# Patient Record
Sex: Male | Born: 1960 | Race: Black or African American | Hispanic: No | Marital: Married | Smoking: Current every day smoker
Health system: Southern US, Community
[De-identification: ages and names within clinical notes are randomized; demographics above are authoritative.]

## PROBLEM LIST (undated history)

## (undated) DIAGNOSIS — I10 Essential (primary) hypertension: Secondary | ICD-10-CM

## (undated) HISTORY — DX: Essential (primary) hypertension: I10

## (undated) HISTORY — PX: BACK SURGERY: SHX140

## (undated) HISTORY — PX: SPINE SURGERY: SHX786

---

## 2001-04-09 ENCOUNTER — Emergency Department (HOSPITAL_COMMUNITY): Admission: EM | Admit: 2001-04-09 | Discharge: 2001-04-09 | Payer: Self-pay | Admitting: Emergency Medicine

## 2004-10-05 ENCOUNTER — Emergency Department: Payer: Self-pay | Admitting: Emergency Medicine

## 2011-09-03 ENCOUNTER — Emergency Department: Payer: Self-pay | Admitting: Emergency Medicine

## 2011-09-07 ENCOUNTER — Ambulatory Visit: Payer: Self-pay | Admitting: Family Medicine

## 2011-09-07 ENCOUNTER — Emergency Department: Payer: Self-pay | Admitting: *Deleted

## 2012-03-01 ENCOUNTER — Emergency Department: Payer: Self-pay | Admitting: Emergency Medicine

## 2012-12-12 ENCOUNTER — Emergency Department: Payer: Self-pay | Admitting: Emergency Medicine

## 2013-07-07 ENCOUNTER — Ambulatory Visit: Payer: Self-pay | Admitting: Family Medicine

## 2013-08-16 ENCOUNTER — Emergency Department: Payer: Self-pay | Admitting: Emergency Medicine

## 2013-08-25 ENCOUNTER — Ambulatory Visit: Payer: Self-pay | Admitting: Anesthesiology

## 2013-08-27 ENCOUNTER — Ambulatory Visit: Payer: Self-pay | Admitting: Family Medicine

## 2013-09-08 ENCOUNTER — Ambulatory Visit: Payer: Self-pay | Admitting: Anesthesiology

## 2013-10-07 ENCOUNTER — Ambulatory Visit: Payer: Self-pay | Admitting: Anesthesiology

## 2013-11-12 ENCOUNTER — Ambulatory Visit: Payer: Self-pay | Admitting: Anesthesiology

## 2013-12-16 ENCOUNTER — Ambulatory Visit: Payer: Self-pay | Admitting: Anesthesiology

## 2015-02-11 NOTE — H&P (Signed)
PATIENT NAME:  Jeffrey Figueroa, Jeffrey Figueroa MR#:  [id number] DATE OF BIRTH:  1961-09-08  DATE OF ADMISSION:  08/25/2013  CHIEF COMPLAINT: Persistent low back pain.   PROCEDURE: None.   HISTORY OF PRESENT ILLNESS: Mr. Jeffrey Figueroa presents for new patient evaluation. He has a complicated history with a previous anterior cervical disk procedure performed at [facility] approximately 1 year ago. He was having substantial left neck and left arm pain at that time from his report and he states that he continues to have this. He is also complaining of some left lower extremity give-way weakness that is happening on a frequent basis, approximately once or twice a day. This is mainly pain related. He also has some pain radiating into the right anterior thigh with a stinging type of pain. He is generally a difficult historian. However, he states that the weakness primarily affects the left lower extremity with pain on movement. No problems with bowel or bladder control are noted at this time. The pain is described as aching, annoying, agonizing, and getting longer and worse. It starts in the right lower back with a pin and needle sensation, radiating into the right anterior thigh, and he has chronic pain in the left knee and foot. He also has chronic left-side shoulder and hand pain with associated numbness and tingling he reports. He has also been followed by Dr. Thana Figueroa for this condition. He is due for an MRI on Thursday.   PAST MEDICAL HISTORY: Significant for high blood pressure. Positive for smoking, depression.   PAST SURGICAL HISTORY: Include appendectomy, neck surgery.   SOCIAL HISTORY: He works at [employer], has 2 children, and started smoking when he was [id number]. He works full-time as a Production designer, theatre/television/filmmanager at [employer].   Recent interventions include gabapentin, ibuprofen, meloxicam.  CURRENT MEDICATIONS: Include gabapentin, ibuprofen, meloxicam.  FAMILY HISTORY: Significant for diabetes, eye problems, high blood pressure.   PHYSICAL  EXAMINATION:  GENERAL: Reveals a pleasant black male, 54 years of age, with a VAS of 7/10.  HEENT: Pupils are equally round and reactive to light. Extraocular muscles intact.  HEART: Regular rate and rhythm without murmur.  LUNGS: Clear to auscultation bilaterally.  VITAL SIGNS: Blood pressure 128/73, pulse 72 and regular, respirations 18, O2 sat 97% with a VAS of 7/10.  EXTREMITIES: With the patient in the supine position, he has a positive straight leg raise at approximately 45 degrees on the left, negative on the right. He has strength at 4/5 with flexion at the left knee and extension at the left foot. Otherwise, his strength appears to be well preserved. With the patient in the standing position, he has pain on extension and right lateral rotation worse than left lateral rotation at the facet joints in the low back. He also has significant paraspinous  muscle tenderness bilaterally.   ASSESSMENT: 1.  Low back syndrome with degenerative disk disease and left-side radicular symptoms, L5 distribution.  2.  Lumbar facet arthropathy, right greater than left.  3.  Myofascial low back pain, severe.  4.  History of previous cervical diskectomy with persistent left-side neuralgic pain affecting the left upper extremity. 5. Continued smoking history with history of hypertension.   PLAN: 1.  We are going to await the lumbar MRI study this Thursday. Based on that, we may consider a lumbar epidural steroid injection for his chronic low back pain.  2.  He also may be a candidate for facet injection for diagnostic purpose.  3.  Continue followup with Dr. Thana Figueroa and continue followup with  his [facility] neurosurgeon for persistent neck pain as indicated.    ____________________________ Jeffrey Figueroa. Jeffrey Dupre, Figueroa jga:jm D: 08/25/2013 16:06:52 ET T: 08/25/2013 17:07:38 ET JOB#: [id number]  cc: Jeffrey Figueroa. Jeffrey Dupre, Figueroa, <Dictator> Jeffrey Mascot, Figueroa Jeffrey Figueroa Jeffrey Figueroa ELECTRONICALLY SIGNED 08/27/2013 16:55

## 2019-03-13 ENCOUNTER — Emergency Department
Admission: EM | Admit: 2019-03-13 | Discharge: 2019-03-13 | Disposition: A | Discharge status: Home / Self Care | Payer: PRIVATE HEALTH INSURANCE | Attending: Emergency Medicine | Admitting: Emergency Medicine

## 2019-03-13 ENCOUNTER — Other Ambulatory Visit: Payer: Self-pay

## 2019-03-13 DIAGNOSIS — F17210 Nicotine dependence, cigarettes, uncomplicated: Secondary | ICD-10-CM | POA: Diagnosis not present

## 2019-03-13 DIAGNOSIS — M79644 Pain in right finger(s): Secondary | ICD-10-CM | POA: Diagnosis present

## 2019-03-13 DIAGNOSIS — L03011 Cellulitis of right finger: Secondary | ICD-10-CM | POA: Diagnosis not present

## 2019-03-13 MED ORDER — CEPHALEXIN 500 MG PO CAPS
500.0000 mg | ORAL_CAPSULE | Freq: Three times a day (TID) | ORAL | 0 refills | Status: DC
Start: 2019-03-13 — End: 2023-08-26

## 2019-03-13 MED ORDER — LIDOCAINE HCL (PF) 1 % IJ SOLN
5.0000 mL | Freq: Once | INTRAMUSCULAR | Status: AC
  Administered 2019-03-13: 5 mL via INTRADERMAL
  Filled 2019-03-13: qty 5

## 2019-03-13 MED ORDER — TRAMADOL HCL 50 MG PO TABS: 50.0000 mg | ORAL_TABLET | Freq: Four times a day (QID) | ORAL | 0 refills | Status: DC | PRN

## 2019-03-13 NOTE — ED Triage Notes (Signed)
Pt c/o pain and swelling at the cuticle of the right thumb for the past 2 days, denies injury

## 2019-03-13 NOTE — ED Provider Notes (Signed)
[facility] Emergency Department Provider Note  ____________________________________________   First MD Initiated Contact with Patient 03/13/19 1032     (approximate)  I have reviewed the triage vital signs and the nursing notes.   HISTORY  Chief Complaint Hand Pain (finger)    HPI Jeffrey Figueroa is a 58 y.o. male presents emergency department with complaints of right thumb pain.  He states that the area has been swollen below the nail.  States he cut his finger nails too short.  Denies any fever or chills.  He has been using warm water to soak the finger without any relief.    History reviewed. No pertinent past medical history.  There are no active problems to display for this patient.   Past Surgical History:  Procedure Laterality Date  . BACK SURGERY      Prior to Admission medications   Medication Sig Start Date End Date Taking? Authorizing Provider  cephALEXin (KEFLEX) 500 MG capsule Take 1 capsule (500 mg total) by mouth 3 (three) times daily. 03/13/19   , Roselyn Bering, PA-C  traMADol (ULTRAM) 50 MG tablet Take 1 tablet (50 mg total) by mouth every 6 (six) hours as needed. 03/13/19   Faythe Ghee, PA-C    Allergies Patient has no known allergies.  No family history on file.  Social History Social History   Tobacco Use  . Smoking status: Current Every Day Smoker    Types: Cigarettes  . Smokeless tobacco: Never Used  Substance Use Topics  . Alcohol use: Yes  . Drug use: Not Currently    Review of Systems  Constitutional: No fever/chills Eyes: No visual changes. ENT: No sore throat. Respiratory: Denies cough Genitourinary: Negative for dysuria. Musculoskeletal: Negative for back pain.  Positive for right thumb pain Skin: Negative for rash.    ____________________________________________   PHYSICAL EXAM:  VITAL SIGNS: ED Triage Vitals  Enc Vitals Group     BP 03/13/19 1034 (!) 146/83     Pulse Rate 03/13/19 1034 77      Resp 03/13/19 1034 18     Temp 03/13/19 1034 98.2 F (36.8 C)     Temp Source 03/13/19 1034 Oral     SpO2 03/13/19 1034 96 %     Weight 03/13/19 1026 190 lb (86.2 kg)     Height 03/13/19 1026 5\' 7"  (1.702 m)     Head Circumference --      Peak Flow --      Pain Score 03/13/19 1026 5     Pain Loc --      Pain Edu? --      Excl. in GC? --     Constitutional: Alert and oriented. Well appearing and in no acute distress. Eyes: Conjunctivae are normal.  Head: Atraumatic. Nose: No congestion/rhinnorhea. Mouth/Throat: Mucous membranes are moist.   Neck:  supple no lymphadenopathy noted Cardiovascular: Normal rate, regular rhythm.  Respiratory: Normal respiratory effort.  No retractions, GU: deferred Musculoskeletal: FROM all extremities, warm and well perfused, right thumb is red swollen and tender below the nailbed typical of a paronychia, neurovascular is intact, redness is not circumferential Neurologic:  Normal speech and language.  Skin:  Skin is warm, dry and intact. No rash noted. Psychiatric: Mood and affect are normal. Speech and behavior are normal.  ____________________________________________   LABS (all labs ordered are listed, but only abnormal results are displayed)  Labs Reviewed - No data to display ____________________________________________   ____________________________________________  RADIOLOGY  ____________________________________________   PROCEDURES  Procedure(s) performed:   Marland Kitchen.Marland Kitchen.Incision and Drainage Date/Time: 03/13/2019 12:02 PM Performed by: Faythe GheeFisher,  W, PA-C Authorized by: Faythe GheeFisher,  W, PA-C   Consent:    Consent obtained:  Verbal   Consent given by:  Patient   Risks discussed:  Bleeding, incomplete drainage, pain and infection   Alternatives discussed:  No treatment Location:    Type:  Abscess   Location:  Upper extremity   Upper extremity location:  Finger   Finger location:  R thumb Pre-procedure details:    Skin  preparation:  Betadine Anesthesia (see MAR for exact dosages):    Anesthesia method:  Nerve block   Block anesthetic:  Lidocaine 1% w/o epi   Block injection procedure:  Anatomic landmarks identified, introduced needle, incremental injection, anatomic landmarks palpated and negative aspiration for blood   Block outcome:  Incomplete block Procedure type:    Complexity:  Simple Procedure details:    Incision types:  Stab incision   Incision depth:  Subcutaneous   Scalpel blade:  11   Drainage:  Purulent   Drainage amount:  Scant   Wound treatment:  Wound left open   Packing materials:  None Post-procedure details:    Patient tolerance of procedure:  Tolerated well, no immediate complications      ____________________________________________   INITIAL IMPRESSION / ASSESSMENT AND PLAN / ED COURSE  Pertinent labs & imaging results that were available during my care of the patient were reviewed by me and considered in my medical decision making (see chart for details).   Patient is 58 year old male presents emergency department with redness and swelling below the nailbed typical of paronychia.  Physical exam shows the area to be red tender and swollen.  Digital block was performed.  Patient states he can still feel the area.  Small incision was made and he tolerated incision well.  Small amount of pus was drained.  Dressing was applied by nursing staff.  Patient was given a prescription for Keflex and tramadol.  To return if worsening.  He is to continue the warm water soaks.  He states he understands will comply.  Is discharged stable condition     As part of my medical decision making, I reviewed the following data within the electronic MEDICAL RECORD NUMBER Nursing notes reviewed and incorporated, Old chart reviewed, Notes from prior ED visits and [place] Controlled Substance Database  ____________________________________________   FINAL CLINICAL IMPRESSION(S) / ED DIAGNOSES  Final  diagnoses:  Paronychia of finger of right hand      NEW MEDICATIONS STARTED DURING THIS VISIT:  Discharge Medication List as of 03/13/2019 11:37 AM    START taking these medications   Details  cephALEXin (KEFLEX) 500 MG capsule Take 1 capsule (500 mg total) by mouth 3 (three) times daily., Starting Fri 03/13/2019, Normal    traMADol (ULTRAM) 50 MG tablet Take 1 tablet (50 mg total) by mouth every 6 (six) hours as needed., Starting Fri 03/13/2019, Normal         Note:  This document was prepared using Dragon voice recognition software and may include unintentional dictation errors.    Faythe GheeFisher,  W, PA-C 03/13/19 1205    Jene EveryKinner, Robert, MD 03/13/19 1259

## 2019-03-13 NOTE — ED Notes (Signed)
See triage note  Presents with pain and swelling to right thumb  Denies any injury but developed sxs' 2 days ago

## 2019-03-13 NOTE — Discharge Instructions (Signed)
Soak the thumb in warm water with Epson salts 3 times daily.  Take the antibiotic as prescribed.  Return to the emergency department if worsening.

## 2021-09-06 ENCOUNTER — Emergency Department
Admission: EM | Admit: 2021-09-06 | Discharge: 2021-09-06 | Disposition: A | Discharge status: Home / Self Care | Payer: No Typology Code available for payment source | Attending: Emergency Medicine | Admitting: Emergency Medicine

## 2021-09-06 ENCOUNTER — Encounter: Payer: Self-pay | Admitting: Emergency Medicine

## 2021-09-06 ENCOUNTER — Emergency Department: Payer: No Typology Code available for payment source

## 2021-09-06 ENCOUNTER — Other Ambulatory Visit: Payer: Self-pay

## 2021-09-06 DIAGNOSIS — M542 Cervicalgia: Secondary | ICD-10-CM | POA: Insufficient documentation

## 2021-09-06 DIAGNOSIS — R531 Weakness: Secondary | ICD-10-CM | POA: Diagnosis not present

## 2021-09-06 DIAGNOSIS — F17210 Nicotine dependence, cigarettes, uncomplicated: Secondary | ICD-10-CM | POA: Diagnosis not present

## 2021-09-06 DIAGNOSIS — R200 Anesthesia of skin: Secondary | ICD-10-CM | POA: Insufficient documentation

## 2021-09-06 DIAGNOSIS — M546 Pain in thoracic spine: Secondary | ICD-10-CM | POA: Insufficient documentation

## 2021-09-06 DIAGNOSIS — Z23 Encounter for immunization: Secondary | ICD-10-CM | POA: Diagnosis not present

## 2021-09-06 MED ORDER — IBUPROFEN 400 MG PO TABS
400.0000 mg | ORAL_TABLET | Freq: Once | ORAL | Status: AC
  Administered 2021-09-06: 400 mg via ORAL
  Filled 2021-09-06: qty 1

## 2021-09-06 MED ORDER — TETANUS-DIPHTH-ACELL PERTUSSIS 5-2.5-18.5 LF-MCG/0.5 IM SUSY
0.5000 mL | PREFILLED_SYRINGE | Freq: Once | INTRAMUSCULAR | Status: AC
  Administered 2021-09-06: 0.5 mL via INTRAMUSCULAR
  Filled 2021-09-06: qty 0.5

## 2021-09-06 NOTE — Discharge Instructions (Signed)
Your CAT scans of your head and neck did not show any injury.  You can take Tylenol and Motrin for your pain.
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
## 2021-12-07 ENCOUNTER — Other Ambulatory Visit: Payer: Self-pay

## 2021-12-07 ENCOUNTER — Emergency Department: Payer: Managed Care, Other (non HMO)

## 2021-12-07 ENCOUNTER — Emergency Department
Admission: EM | Admit: 2021-12-07 | Discharge: 2021-12-07 | Disposition: A | Discharge status: Home / Self Care | Payer: Managed Care, Other (non HMO) | Attending: Emergency Medicine | Admitting: Emergency Medicine

## 2021-12-07 DIAGNOSIS — R059 Cough, unspecified: Secondary | ICD-10-CM | POA: Diagnosis present

## 2021-12-07 DIAGNOSIS — U071 COVID-19: Secondary | ICD-10-CM | POA: Diagnosis not present

## 2021-12-07 LAB — RESP PANEL BY RT-PCR (FLU A&B, COVID) ARPGX2
Influenza A by PCR: NEGATIVE
Influenza B by PCR: NEGATIVE
SARS Coronavirus 2 by RT PCR: POSITIVE — AB

## 2021-12-07 MED ORDER — ONDANSETRON 4 MG PO TBDP
4.0000 mg | ORAL_TABLET | Freq: Once | ORAL | Status: AC
  Administered 2021-12-07: 4 mg via ORAL
  Filled 2021-12-07: qty 1

## 2021-12-07 MED ORDER — HYDROCOD POLI-CHLORPHE POLI ER 10-8 MG/5ML PO SUER: 5.0000 mL | Freq: Every evening | ORAL | 0 refills | Status: DC | PRN

## 2021-12-07 MED ORDER — ONDANSETRON 4 MG PO TBDP: 4.0000 mg | ORAL_TABLET | Freq: Three times a day (TID) | ORAL | 0 refills | Status: DC | PRN

## 2021-12-07 NOTE — ED Notes (Signed)
Pt states that he has congestion, headache, upset stomach, onset Tuesday. Pt reports he has not eaten anything since Tuesday. Denies vomiting/diarrhea. Pt took home COVID test & it was positive.

## 2021-12-07 NOTE — ED Triage Notes (Signed)
Pt presents to ER c/o fatigue, body aches, loss of taste, productive cough and congestion that started on Tuesday.  Pt states he took home covid test and was positive.  Pt denies sob at this time.  Pt A&O x4 otherwise.

## 2021-12-07 NOTE — Discharge Instructions (Signed)
Take nausea medication as needed.  Use nighttime cough medication as needed.  If any worsening cough, shortness of breath return to the ER.

## 2021-12-07 NOTE — ED Provider Notes (Signed)
[facility] EMERGENCY DEPARTMENT Provider Note   CSN: [id number] Arrival date & time: 12/07/21  2043     History  Chief Complaint  Patient presents with   COVID Symptoms     Jeffrey Figueroa is a 61 y.o. male with no known past medical history presents to the emergency department for evaluation of body aches, chills, nausea, cough, congestion, diarrhea.  Symptoms have been present for 2 days.  He is tolerating p.o. well, mostly taking in some fluids.  Denies any abdominal pain, high fevers, chest pain, shortness of breath.  No rashes.  He has been taking some over-the-counter medications with mild relief.  Denies any wheezing  HPI     Home Medications Prior to Admission medications   Medication Sig Start Date End Date Taking? Authorizing Provider  chlorpheniramine-HYDROcodone (TUSSIONEX PENNKINETIC ER) 10-8 MG/5ML Take 5 mLs by mouth at bedtime as needed for cough. 12/07/21  Yes Evon Slack, PA-C  ondansetron (ZOFRAN-ODT) 4 MG disintegrating tablet Take 1 tablet (4 mg total) by mouth every 8 (eight) hours as needed for nausea or vomiting. 12/07/21  Yes Evon Slack, PA-C  cephALEXin (KEFLEX) 500 MG capsule Take 1 capsule (500 mg total) by mouth 3 (three) times daily. 03/13/19   Fisher, Roselyn Bering, PA-C  traMADol (ULTRAM) 50 MG tablet Take 1 tablet (50 mg total) by mouth every 6 (six) hours as needed. 03/13/19   Faythe Ghee, PA-C      Allergies    Patient has no known allergies.    Review of Systems   Review of Systems  Constitutional:  Positive for chills. Negative for fever.  HENT:  Positive for congestion, rhinorrhea and sore throat. Negative for sinus pressure and sinus pain.   Respiratory:  Positive for cough. Negative for shortness of breath, wheezing and stridor.   Cardiovascular:  Negative for chest pain.  Gastrointestinal:  Negative for abdominal pain, diarrhea, nausea and vomiting.  Genitourinary:  Negative for difficulty urinating, dysuria and  urgency.  Musculoskeletal:  Negative for arthralgias, back pain, myalgias and neck stiffness.  Skin:  Negative for rash.  Neurological:  Negative for dizziness and headaches.   Physical Exam Updated Vital Signs BP 127/86 (BP Location: Left Arm)    Pulse (!) 105    Temp 98.6 F (37 C) (Oral)    Resp 20    Ht 5\' 7"  (1.702 m)    Wt 89.8 kg    SpO2 93%    BMI 31.01 kg/m  Physical Exam Constitutional:      General: He is not in acute distress.    Appearance: Normal appearance. He is well-developed. He is not ill-appearing.  HENT:     Head: Normocephalic and atraumatic.     Nose: Rhinorrhea present.     Mouth/Throat:     Pharynx: No oropharyngeal exudate or posterior oropharyngeal erythema.  Eyes:     Conjunctiva/sclera: Conjunctivae normal.  Cardiovascular:     Rate and Rhythm: Normal rate.     Pulses: Normal pulses.     Heart sounds: Normal heart sounds.  Pulmonary:     Effort: Pulmonary effort is normal. No respiratory distress.     Breath sounds: Normal breath sounds. No stridor. No wheezing, rhonchi or rales.  Abdominal:     General: Abdomen is flat. There is no distension.     Tenderness: There is no abdominal tenderness. There is no guarding.  Musculoskeletal:        General: Normal range of motion.  Cervical back: Normal range of motion.  Skin:    General: Skin is warm.     Findings: No rash.  Neurological:     General: No focal deficit present.     Mental Status: He is alert and oriented to person, place, and time.  Psychiatric:        Behavior: Behavior normal.        Thought Content: Thought content normal.    ED Results / Procedures / Treatments   Labs (all labs ordered are listed, but only abnormal results are displayed) Labs Reviewed  RESP PANEL BY RT-PCR (FLU A&B, COVID) ARPGX2 - Abnormal; Notable for the following components:      Result Value   SARS Coronavirus 2 by RT PCR POSITIVE (*)    All other components within normal limits     EKG None  Radiology DG Chest 1 View  Result Date: 12/07/2021 CLINICAL DATA:  No history EXAM: CHEST  1 VIEW COMPARISON:  None. FINDINGS: The heart and mediastinal contours are unchanged. Aortic calcification. No focal consolidation. No pulmonary edema. No pleural effusion. No pneumothorax. No acute osseous abnormality. IMPRESSION: 1. No active disease. 2.  Aortic Atherosclerosis (ICD10-I70.0). Electronically Signed   By: Tish Frederickson M.D.   On: 12/07/2021 21:18    Procedures Procedures    Medications Ordered in ED Medications  ondansetron (ZOFRAN-ODT) disintegrating tablet 4 mg (has no administration in time range)    ED Course/ Medical Decision Making/ A&P                           Medical Decision Making Amount and/or Complexity of Data Reviewed Radiology: ordered.  Risk Prescription drug management.   61 year old male COVID-positive.  Has had symptoms for 2 days.  His vital signs appear stable.  He is not complaining of any chest pain or shortness of breath.  He is having some cough and nausea.  Patient a candidate for Paxlovid pending blood work to check kidney function but unfortunately patient with no recent blood work and unwilling to stay for blood work to ensure kidney function is within normal limits to prescribe Paxlovid.  Patient requested prescription for cough medication and nausea medication, states he is ready to be discharged.  Patient understands signs and symptoms return to the ER for such as any shortness of breath, fevers above 101 that or not going down with Tylenol, inability to keep food or fluids down or any urgent changes in his health Final Clinical Impression(s) / ED Diagnoses Final diagnoses:  Cough  COVID-19    Rx / DC Orders ED Discharge Orders          Ordered    chlorpheniramine-HYDROcodone (TUSSIONEX PENNKINETIC ER) 10-8 MG/5ML  At bedtime PRN        12/07/21 2243    ondansetron (ZOFRAN-ODT) 4 MG disintegrating tablet  Every 8 hours  PRN        12/07/21 2243              Evon Slack, PA-C 12/07/21 2249    Merwyn Katos, MD 12/08/21 1640

## 2022-03-01 IMAGING — CR DG CHEST 1V
1 series · 1 of 1 positions shown · non-contrast
Comparison: None.

CLINICAL DATA: No history

EXAM:
CHEST  1 VIEW

[chest pa]
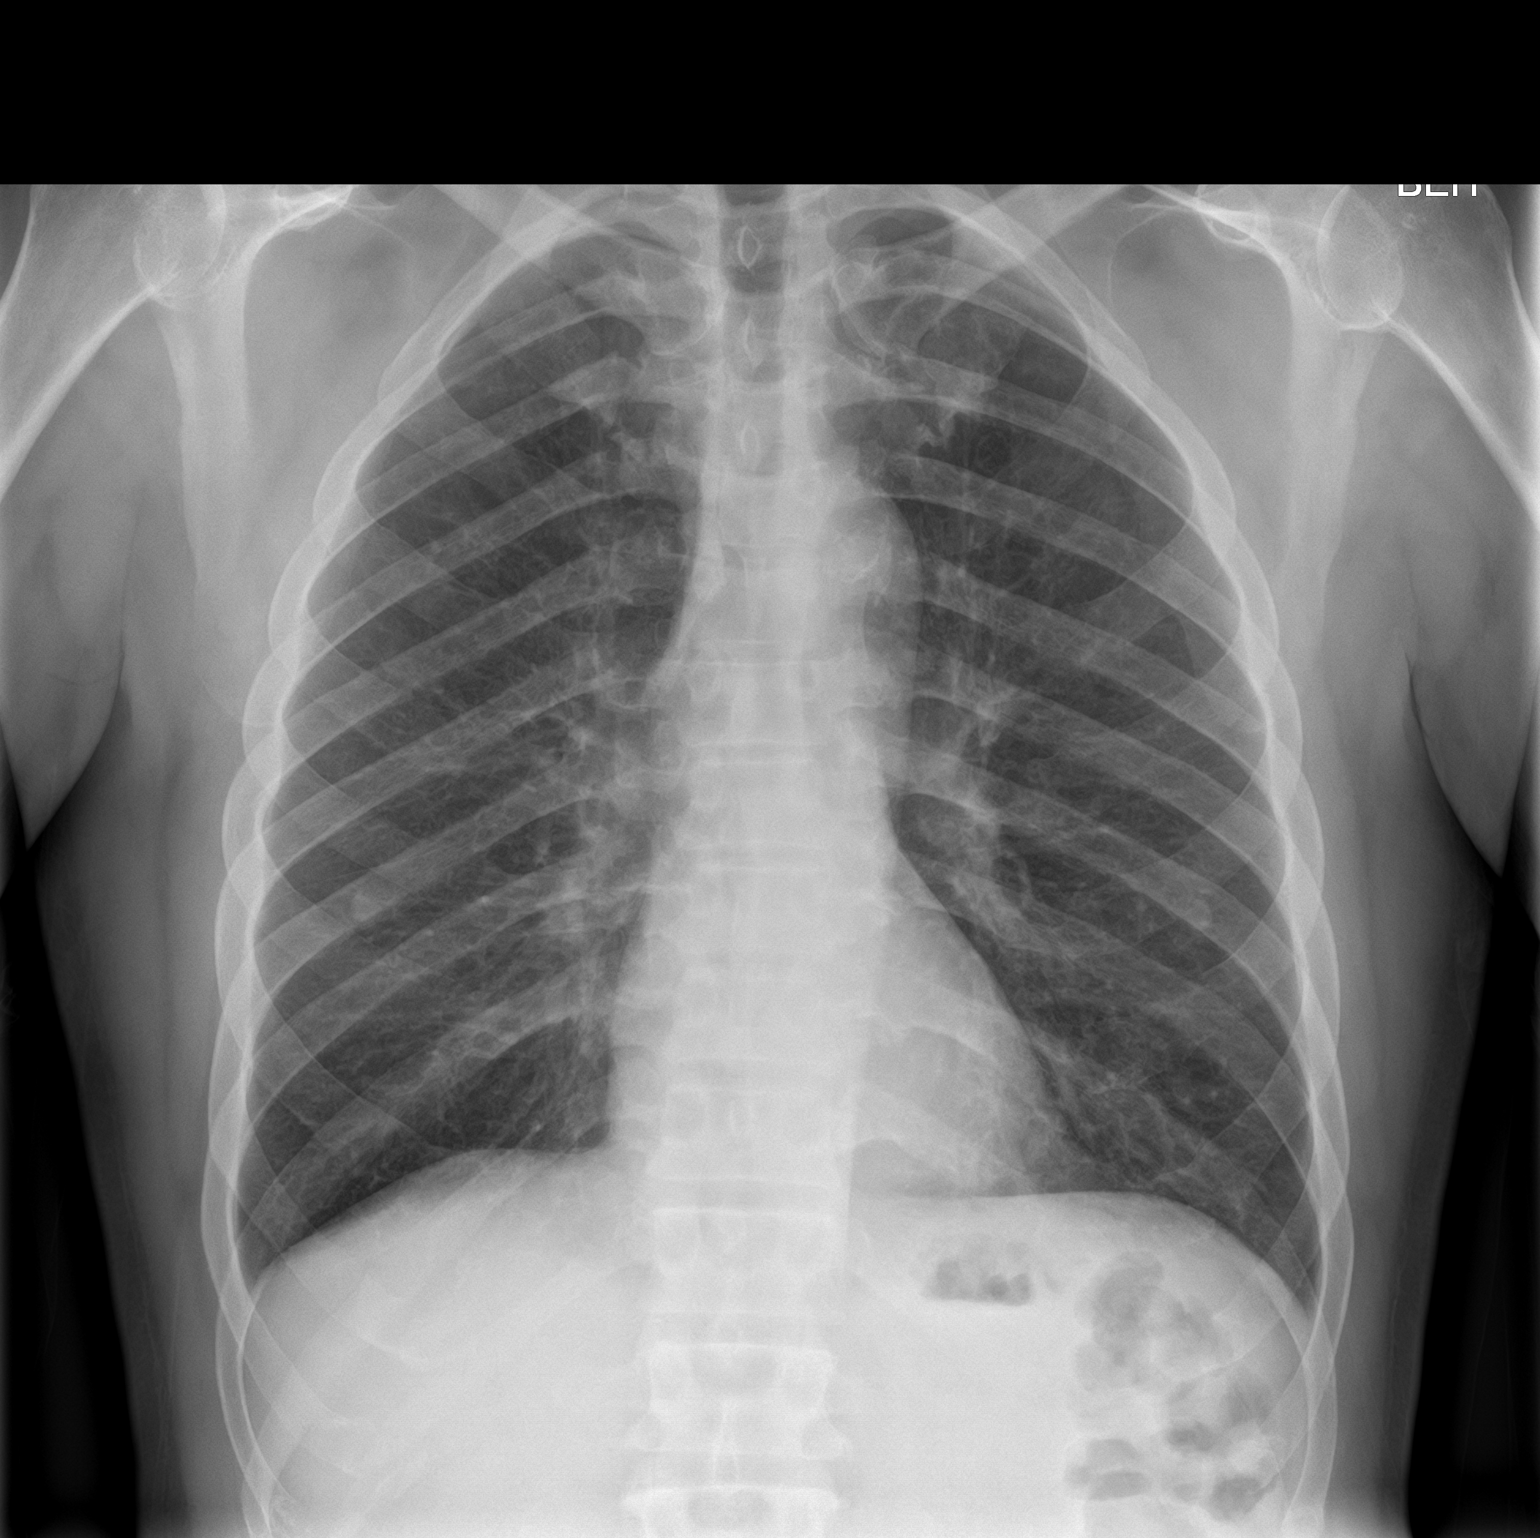

[1 of 1 positions shown; findings below may reference images not displayed]

FINDINGS: The heart and mediastinal contours are unchanged. Aortic
calcification.

No focal consolidation. No pulmonary edema. No pleural effusion. No
pneumothorax.

No acute osseous abnormality.
IMPRESSION: 1. No active disease.
2.  Aortic Atherosclerosis ([id number]).

## 2023-08-16 ENCOUNTER — Ambulatory Visit: Payer: No Typology Code available for payment source

## 2023-08-16 ENCOUNTER — Encounter: Payer: Self-pay | Admitting: Emergency Medicine

## 2023-08-16 ENCOUNTER — Ambulatory Visit
Admission: EM | Admit: 2023-08-16 | Discharge: 2023-08-16 | Disposition: A | Discharge status: Home / Self Care | Payer: No Typology Code available for payment source | Attending: Physician Assistant | Admitting: Physician Assistant

## 2023-08-16 DIAGNOSIS — R0781 Pleurodynia: Secondary | ICD-10-CM

## 2023-08-16 DIAGNOSIS — W19XXXA Unspecified fall, initial encounter: Secondary | ICD-10-CM

## 2023-08-16 NOTE — ED Triage Notes (Signed)
Patient states that he fell couple days ago.  Patient reports ongoing left mid back pain.  Patient also reports pain in his ribs when he coughs or takes a deep breath.

## 2023-08-16 NOTE — ED Provider Notes (Signed)
[facility]    CSN: [id number] Arrival date & time: 08/16/23  1014      History   Chief Complaint Chief Complaint  Patient presents with   Fall   Rib Pain    HPI Jeffrey Figueroa is a 62 y.o. male.   Patient is here for left-sided rib pain back pain.  Patient states that he fell a couple days ago falling onto his left side states that he is concerned he has a rib fracture it is a hard time to take a deep breath or when he coughs.  Patient did not lose consciousness at the time alert and oriented.  Patient has been taking ibuprofen with minimal relief.  Denies any chest pain or bloody emesis.    History reviewed. No pertinent past medical history.  There are no problems to display for this patient.   Past Surgical History:  Procedure Laterality Date   BACK SURGERY         Home Medications    Prior to Admission medications   Medication Sig Start Date End Date Taking? Authorizing Provider  cephALEXin (KEFLEX) 500 MG capsule Take 1 capsule (500 mg total) by mouth 3 (three) times daily. 03/13/19   Sherrie Mustache Roselyn Bering, PA-C  chlorpheniramine-HYDROcodone (TUSSIONEX PENNKINETIC ER) 10-8 MG/5ML Take 5 mLs by mouth at bedtime as needed for cough. 12/07/21   Evon Slack, PA-C  ondansetron (ZOFRAN-ODT) 4 MG disintegrating tablet Take 1 tablet (4 mg total) by mouth every 8 (eight) hours as needed for nausea or vomiting. 12/07/21   Evon Slack, PA-C  traMADol (ULTRAM) 50 MG tablet Take 1 tablet (50 mg total) by mouth every 6 (six) hours as needed. 03/13/19   Sherrie Mustache Roselyn Bering, PA-C    Family History History reviewed. No pertinent family history.  Social History Social History   Tobacco Use   Smoking status: Every Day    Types: Cigarettes   Smokeless tobacco: Never  Vaping Use   Vaping status: Never Used  Substance Use Topics   Alcohol use: Yes   Drug use: Not Currently     Allergies   Patient has no known allergies.   Review of Systems Review of Systems   Constitutional: Negative.   Respiratory:  Positive for cough. Negative for shortness of breath, wheezing and stridor.        Left-sided rib pain that radiates slightly towards back  Cardiovascular: Negative.   Gastrointestinal: Negative.   Musculoskeletal:        Slight rib and back pain on left side  Skin: Negative.   Neurological: Negative.      Physical Exam Triage Vital Signs ED Triage Vitals  Encounter Vitals Group     BP 08/16/23 1059 (!) 167/93     Systolic BP Percentile --      Diastolic BP Percentile --      Pulse Rate 08/16/23 1059 73     Resp 08/16/23 1059 15     Temp 08/16/23 1059 98.2 F (36.8 C)     Temp Source 08/16/23 1059 Oral     SpO2 08/16/23 1059 96 %     Weight 08/16/23 1057 172 lb (78 kg)     Height 08/16/23 1057 5\' 7"  (1.702 m)     Head Circumference --      Peak Flow --      Pain Score 08/16/23 1057 8     Pain Loc --      Pain Education --  Exclude from Growth Chart --    No data found.  Updated Vital Signs BP (!) 167/93 (BP Location: Right Arm)   Pulse 73   Temp 98.2 F (36.8 C) (Oral)   Resp 15   Ht 5\' 7"  (1.702 m)   Wt 172 lb (78 kg)   SpO2 96%   BMI 26.94 kg/m   Visual Acuity Right Eye Distance:   Left Eye Distance:   Bilateral Distance:    Right Eye Near:   Left Eye Near:    Bilateral Near:     Physical Exam Constitutional:      Appearance: Normal appearance.  Cardiovascular:     Rate and Rhythm: Normal rate.  Pulmonary:     Effort: Pulmonary effort is normal.  Abdominal:     General: Abdomen is flat.  Musculoskeletal:        General: Normal range of motion.     Cervical back: Normal range of motion.     Comments: Tenderness to palpation to left rib area second and third rib.  No signs of bruising no noted edema.  Neurological:     General: No focal deficit present.     Mental Status: He is alert.      UC Treatments / Results  Labs (all labs ordered are listed, but only abnormal results are  displayed) Labs Reviewed - No data to display  EKG   Radiology DG Ribs Unilateral W/Chest Left  Result Date: 08/16/2023 CLINICAL DATA:  Fall, back and rib pain EXAM: LEFT RIBS AND CHEST-5 VIEW COMPARISON:  12/07/2021 FINDINGS: No displaced fracture or other bone lesions are seen involving the ribs. There is no evidence of pneumothorax or pleural effusion. Both lungs are clear. Cardiac and mediastinal contours are within normal limits. IMPRESSION: No displaced rib fracture.  No acute cardiopulmonary process. Electronically Signed   By: Wiliam Ke M.D.   On: 08/16/2023 11:35    Procedures Procedures (including critical care time)  Medications Ordered in UC Medications - No data to display  Initial Impression / Assessment and Plan / UC Course  I have reviewed the triage vital signs and the nursing notes.  Pertinent labs & imaging results that were available during my care of the patient were reviewed by me and considered in my medical decision making (see chart for details).     X-ray was negative for any rib fractures Discussed with patient signs of pneumonia to monitor for patient will need to continue to take deep breaths and apply warm compresses to rib area. Can either take ibuprofen as needed for pain If symptoms persist after 1 week he can follow-up for follow-up Final Clinical Impressions(s) / UC Diagnoses   Final diagnoses:  Fall, initial encounter  Rib pain on left side     Discharge Instructions      X-ray was negative for any rib fractures Discussed with patient signs of pneumonia to monitor for patient will need to continue to take deep breaths and apply warm compresses to rib area. Can either take ibuprofen as needed for pain If symptoms persist after 1 week he can follow-up for follow-up     ED Prescriptions   None    PDMP not reviewed this encounter.   Coralyn Mark, NP 08/16/23 1148

## 2023-08-16 NOTE — Discharge Instructions (Addendum)
X-ray was negative for any rib fractures Discussed with patient signs of pneumonia to monitor for patient will need to continue to take deep breaths and apply warm compresses to rib area. Can either take ibuprofen as needed for pain If symptoms persist after 1 week he can follow-up for follow-up

## 2023-08-19 ENCOUNTER — Other Ambulatory Visit: Payer: Self-pay

## 2023-08-19 ENCOUNTER — Emergency Department
Admission: EM | Admit: 2023-08-19 | Discharge: 2023-08-20 | Disposition: A | Discharge status: Home / Self Care | Payer: 59 | Attending: Emergency Medicine | Admitting: Emergency Medicine

## 2023-08-19 ENCOUNTER — Ambulatory Visit: Payer: Self-pay | Admitting: *Deleted

## 2023-08-19 DIAGNOSIS — R109 Unspecified abdominal pain: Secondary | ICD-10-CM | POA: Diagnosis present

## 2023-08-19 DIAGNOSIS — K5900 Constipation, unspecified: Secondary | ICD-10-CM | POA: Insufficient documentation

## 2023-08-19 LAB — COMPREHENSIVE METABOLIC PANEL
ALT: 17 U/L (ref 0–44)
AST: 20 U/L (ref 15–41)
Albumin: 4.4 g/dL (ref 3.5–5.0)
Alkaline Phosphatase: 67 U/L (ref 38–126)
Anion gap: 10 (ref 5–15)
BUN: 12 mg/dL (ref 8–23)
CO2: 22 mmol/L (ref 22–32)
Calcium: 8.8 mg/dL — ABNORMAL LOW (ref 8.9–10.3)
Chloride: 103 mmol/L (ref 98–111)
Creatinine, Ser: 0.81 mg/dL (ref 0.61–1.24)
GFR, Estimated: >60 mL/min (ref >60)
Glucose, Bld: 119 mg/dL — ABNORMAL HIGH (ref 70–99)
Potassium: 3.5 mmol/L (ref 3.5–5.1)
Sodium: 135 mmol/L (ref 135–145)
Total Bilirubin: 0.9 mg/dL (ref 0.3–1.2)
Total Protein: 7.8 g/dL (ref 6.5–8.1)

## 2023-08-19 LAB — CBC
HCT: 47.8 % (ref 39.0–52.0)
Hemoglobin: 15.8 g/dL (ref 13.0–17.0)
MCH: 33.4 pg (ref 26.0–34.0)
MCHC: 33.1 g/dL (ref 30.0–36.0)
MCV: 101.1 fL — ABNORMAL HIGH (ref 80.0–100.0)
Platelets: 180 10*3/uL (ref 150–400)
RBC: 4.73 MIL/uL (ref 4.22–5.81)
RDW: 11.9 % (ref 11.5–15.5)
WBC: 7 10*3/uL (ref 4.0–10.5)
nRBC: 0 % (ref 0.0–0.2)

## 2023-08-19 LAB — LIPASE, BLOOD: Lipase: 25 U/L (ref 11–51)

## 2023-08-19 NOTE — Telephone Encounter (Signed)
Attempted to call patient and wife at contact numbers provided. No answer on both numbers and call back message left. Since third attempt to reach patient- call will be closed.
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
## 2023-08-19 NOTE — ED Triage Notes (Addendum)
Pt to ED via POV c/o generalized abd pain and constipation x4days. Pt reports abd pain ever since he fell 4 days ago. Pt also having left mid back pain from fall. Pt ahs been using enema, laxatives with no relief. Denies any fevers, cp, sob, dizziness

## 2023-08-20 MED ORDER — LACTULOSE 10 GM/15ML PO SOLN
20.0000 g | Freq: Once | ORAL | Status: AC
  Administered 2023-08-20: 20 g via ORAL
  Filled 2023-08-20: qty 30

## 2023-08-20 NOTE — ED Provider Notes (Signed)
[facility] Provider Note    Event Date/Time   First MD Initiated Contact with Patient 08/19/23 2346     (approximate)   History   Abdominal Pain   HPI Jeffrey Figueroa is a 62 y.o. male who presents for evaluation of about 4 to 5 days of constipation and resultant abdominal pain.  He said he had a fall about 4 days ago and he was evaluated at urgent care and told he did not have any broken ribs.  He still has some pain associated with that in the left lower part of his back.  However he said that his main issue is that he has not been able to have a good bowel movement.  He has taken a laxative that he got from [facility] but it has not worked.  Normally he goes once a day but it has been a couple of days since he has passed much of anything.  He is passing gas.  No vomiting but he feels like if he eats anything it "sits on my stomach" for a long time.  No recent surgery or immobilization.  He has not been taking narcotics.     Physical Exam   Triage Vital Signs: ED Triage Vitals [08/19/23 2009]  Encounter Vitals Group     BP (!) 136/97     Systolic BP Percentile      Diastolic BP Percentile      Pulse Rate 78     Resp 20     Temp 98.2 F (36.8 C)     Temp Source Oral     SpO2 99 %     Weight      Height      Head Circumference      Peak Flow      Pain Score 9     Pain Loc      Pain Education      Exclude from Growth Chart     Most recent vital signs: Vitals:   08/20/23 0004 08/20/23 0019  BP: (!) 178/85 (!) 178/85  Pulse: 62 80  Resp:  17  Temp:  98.4 F (36.9 C)  SpO2: 100% 100%    General: Awake, no distress.  Generally well-appearing. CV:  Good peripheral perfusion.  Regular rate and rhythm. Resp:  Normal effort. Speaking easily and comfortably, no accessory muscle usage nor intercostal retractions.   Abd:  No distention.  No tenderness to palpation of the abdomen.  Patient continues to be able to talk and give me a history all  during deep palpation. Other:  Normal mood and affect.   ED Results / Procedures / Treatments   Labs (all labs ordered are listed, but only abnormal results are displayed) Labs Reviewed  COMPREHENSIVE METABOLIC PANEL - Abnormal; Notable for the following components:      Result Value   Glucose, Bld 119 (*)    Calcium 8.8 (*)    All other components within normal limits  CBC - Abnormal; Notable for the following components:   MCV 101.1 (*)    All other components within normal limits  LIPASE, BLOOD     PROCEDURES:  Critical Care performed: No  Procedures    IMPRESSION / MDM / ASSESSMENT AND PLAN / ED COURSE  I reviewed the triage vital signs and the nursing notes.  Differential diagnosis includes, but is not limited to, constipation, SBO/ileus, less likely intra-abdominal bleed.  Patient's presentation is most consistent with acute presentation with potential threat to life or bodily function.  Labs/studies ordered: CMP, lipase, CBC  Interventions/Medications given:  Medications  lactulose (CHRONULAC) 10 GM/15ML solution 20 g (has no administration in time range)    (Note:  hospital course my include additional interventions and/or labs/studies not listed above.)   Patient is hypertensive but otherwise normal vital signs.  Very benign abdominal exam.  He has no tenderness to palpation at all.  Some musculoskeletal pain from his fall 4 days ago, and I read the urgent care note and verified the report.  But I have no concern for acute intra-abdominal bleeding, perforation, nor infection based on his physical exam.  Similarly, his lab work is very reassuring with a normal CMP, lipase, and CBC.  I had my usual constipation discussion with the patient and he will get a dose of lactulose prior to discharge.  He will try an aggressive regimen at home of OTC constipation remedies which I provided in written form.  He and his wife agree with the  plan and I gave my usual return precautions.  No indication for imaging at this time.         FINAL CLINICAL IMPRESSION(S) / ED DIAGNOSES   Final diagnoses:  Constipation, unspecified constipation type     Rx / DC Orders   ED Discharge Orders     None        Note:  This document was prepared using Dragon voice recognition software and may include unintentional dictation errors.   Loleta Rose, MD 08/20/23 0100

## 2023-08-20 NOTE — Discharge Instructions (Addendum)
You were seen in the emergency department today for constipation.  We recommend that you use one or more of the following over-the-counter medications in the order described:   1)  Miralax (powder):  This medication works by drawing additional fluid into your intestines and helps to flush out your stool.  Mix the powder with water or juice according to label instructions.  Be sure to use the recommended amount of water or juice when you mix up the powder.  Plenty of fluids will help to prevent constipation.  You can take this twice a day; just be sure to drink plenty of fluids. 2)  Colace (or Dulcolax) 100 mg:  This is a stool softener, and you may take it once or twice a day as needed. 3)  Senna tablets:  This is a bowel stimulant that will help "push" out your stool. It is the next step to add after you have tried a stool softener. 4)  Magnesium citrate: This is typically sold in a clear glass bottle also purchased without the need for prescription.  You can drink the bottle and it typically stimulates your bowels within a short period of time.  You may also want to consider using glycerin suppositories, which you insert into your rectum.  You hold it in place and is dissolves and softens your stool and stimulates your bowels.  You could also consider using an enema, which is also available over the counter.  Remember that narcotic pain medications are constipating, so avoid them or minimize their use.  Drink plenty of fluids.  Please return to the Emergency Department immediately if you develop new or worsening symptoms that concern you, such as (but not limited to) fever > 101 degrees, severe abdominal pain, or persistent vomiting.

## 2023-08-26 ENCOUNTER — Encounter (HOSPITAL_BASED_OUTPATIENT_CLINIC_OR_DEPARTMENT_OTHER): Payer: Self-pay | Admitting: Family Medicine

## 2023-08-26 ENCOUNTER — Ambulatory Visit (INDEPENDENT_AMBULATORY_CARE_PROVIDER_SITE_OTHER): Payer: 59 | Admitting: Family Medicine

## 2023-08-26 VITALS — BP 175/86 | HR 58 | Ht 67.0 in | Wt 165.0 lb

## 2023-08-26 DIAGNOSIS — R030 Elevated blood-pressure reading, without diagnosis of hypertension: Secondary | ICD-10-CM | POA: Diagnosis not present

## 2023-08-26 DIAGNOSIS — R1084 Generalized abdominal pain: Secondary | ICD-10-CM | POA: Insufficient documentation

## 2023-08-26 DIAGNOSIS — R10811 Right upper quadrant abdominal tenderness: Secondary | ICD-10-CM | POA: Insufficient documentation

## 2023-08-26 DIAGNOSIS — I10 Essential (primary) hypertension: Secondary | ICD-10-CM | POA: Insufficient documentation

## 2023-08-26 LAB — HEMOGLOBIN A1C
Est. average glucose Bld gHb Est-mCnc: 111 mg/dL
Hgb A1c MFr Bld: 5.5 % (ref 4.8–5.6)

## 2023-08-26 NOTE — Progress Notes (Signed)
New Patient Office Visit  Subjective    Patient ID: Jeffrey Figueroa, male    DOB: 07-Apr-1961  Age: 62 y.o. MRN: [id number]  CC:  Chief Complaint  Patient presents with   Abdominal Pain    Left side, ongoing since recent fall on 10/25   Bloated    Feels like food just sits on his stomach, indigestion     HPI Jeffrey Figueroa is a 62 year old male who presents to establish with Primary Care & Sports Medicine at [facility]. He has concerns today about ongoing abdominal pain from 08/17/2023. He reports experiencing 2 days of abdominal pain before going to [employer]- seen on 10/28 with no abnormal findings & benign exam.  He reports this abdominal pain is constant- reports that his stomach is "always grumbling." He reports suffering from constipation- LBM yesterday.   He feels like foot just sits in his stomach and there are some days where he just cannot eat.  Reports one day where it was black. Denies bright red blood, N/V, change in appetite, weight loss, fever/chills, urinary symptoms.  His BM was soft, not hard to pass. Patient reports no relief with OTC enema or laxatives, even no relief with Lactulose prescribed.   He reports suffering from a fall on 08/16/2023 with L-sided rib pain.  Reports the pain feels like 5/10 and does not radiate.  Ibuprofen- does help the pain   Outpatient Encounter Medications as of 08/26/2023  Medication Sig   [DISCONTINUED] cephALEXin (KEFLEX) 500 MG capsule Take 1 capsule (500 mg total) by mouth 3 (three) times daily. (Patient not taking: Reported on 08/26/2023)   [DISCONTINUED] chlorpheniramine-HYDROcodone (TUSSIONEX PENNKINETIC ER) 10-8 MG/5ML Take 5 mLs by mouth at bedtime as needed for cough. (Patient not taking: Reported on 08/26/2023)   [DISCONTINUED] ondansetron (ZOFRAN-ODT) 4 MG disintegrating tablet Take 1 tablet (4 mg total) by mouth every 8 (eight) hours as needed for nausea or vomiting. (Patient not taking: Reported on  08/26/2023)   [DISCONTINUED] traMADol (ULTRAM) 50 MG tablet Take 1 tablet (50 mg total) by mouth every 6 (six) hours as needed. (Patient not taking: Reported on 08/26/2023)   No facility-administered encounter medications on file as of 08/26/2023.    History reviewed. No pertinent past medical history.  Past Surgical History:  Procedure Laterality Date   BACK SURGERY     SPINE SURGERY     Titanium Rod    No family history on file.  Review of Systems  Constitutional:  Negative for chills, fever, malaise/fatigue and weight loss.  Respiratory:  Negative for cough and shortness of breath.   Cardiovascular:  Negative for chest pain and palpitations.  Gastrointestinal:  Positive for abdominal pain. Negative for blood in stool, constipation, diarrhea, heartburn, melena, nausea and vomiting.  Genitourinary:  Negative for frequency and urgency.  Neurological:  Negative for dizziness, weakness and headaches.  Psychiatric/Behavioral:  Negative for depression and suicidal ideas. The patient is not nervous/anxious and does not have insomnia.      Objective    BP (!) 175/86 Comment: Repeat BP  Pulse (!) 58   Ht 5\' 7"  (1.702 m)   Wt 165 lb (74.8 kg)   SpO2 100%   BMI 25.84 kg/m   Physical Exam Constitutional:      Appearance: Normal appearance. He is well-developed.  Cardiovascular:     Rate and Rhythm: Normal rate and regular rhythm.     Heart sounds: Normal heart sounds.  Pulmonary:     Effort: Pulmonary effort  is normal.     Breath sounds: Normal breath sounds.  Abdominal:     General: Abdomen is flat. Bowel sounds are normal. There is no distension.     Palpations: Abdomen is soft. There is no hepatomegaly, splenomegaly or mass.     Tenderness: There is generalized abdominal tenderness and tenderness in the right upper quadrant. There is no right CVA tenderness, left CVA tenderness, guarding or rebound.     Hernia: No hernia is present.  Neurological:     Mental Status: He is  alert.  Psychiatric:        Mood and Affect: Mood normal.        Behavior: Behavior normal.      Assessment & Plan:   1. Generalized abdominal pain Patient is a 62 year old male who presents today for concerns regarding ongoing abdominal pain with reported constipation. He reports that his last bowel movement was yesterday but has not had a "good" bowel movement. Reports an increase in bowel sounds and activity. He feels that food "sits on my stomach" whenever he eats something. Denies N/V, diarrhea, blood in stool, melena, loss of appetite, change in bowel movements. Soft abdomen with noted generalized abdominal pain in all quadrants, more tenderness in the RUQ to palpation. No guarding or rebound tenderness present. No acute red flags present on exam- less likely to have acute infection, perforation, or abdominal bleeding. Discussed OTC medications to prevent constipation. Will obtain liver and pancreatic function today and update patient with labs. Will obtain hemoglobin A1c to determine if patient has diabetes, causing delayed gastric emptying. Will also order abdominal US to determine etiology of abdominal pain. If imaging is unremarkable, would be reasonable to refer to GI for further evaluation. Patient reports he has never had a colonoscopy. - US Abdomen Complete; Future - Hemoglobin A1c  2. Right upper quadrant abdominal tenderness without rebound tenderness Patient reports more tenderness in his RUQ. No rebound tenderness or guarding present. Will obtain BMP, amylase, and hemoglobin A1c today. Ordered complete abdominal US for generalized pain in all quadrants. No acute findings on physical exam.  - Basic Metabolic Panel (BMET) - Amylase  3. Elevated blood pressure reading in office without diagnosis of hypertension Patient presents today with slightly elevated blood pressure, repeat with elevated blood pressure. Patient in no acute distress and is well-appearing. Denies chest pain,  shortness of breath, lower extremity edema, vision changes, headaches. Cardiovascular exam with heart regular rate and rhythm. Normal heart sounds, no murmurs present. No lower extremity edema present. Lungs clear to auscultation bilaterally. Patient is not currently taking pharmacotherapy for hypertension. Advised patient to monitor blood pressure at home. Follow-up in 6-8 weeks for elevated blood pressure.      Return in 6 weeks (on 10/07/2023), or if symptoms worsen or fail to improve, for elevated BP.   Alyson Reedy, FNP

## 2023-08-26 NOTE — Patient Instructions (Signed)

## 2023-08-27 ENCOUNTER — Other Ambulatory Visit (HOSPITAL_BASED_OUTPATIENT_CLINIC_OR_DEPARTMENT_OTHER): Payer: Self-pay | Admitting: Family Medicine

## 2023-08-27 DIAGNOSIS — R1084 Generalized abdominal pain: Secondary | ICD-10-CM

## 2023-08-27 LAB — BASIC METABOLIC PANEL
BUN/Creatinine Ratio: 13 (ref 10–24)
BUN: 12 mg/dL (ref 8–27)
CO2: 20 mmol/L (ref 20–29)
Calcium: 9.4 mg/dL (ref 8.6–10.2)
Chloride: 104 mmol/L (ref 96–106)
Creatinine, Ser: 0.9 mg/dL (ref 0.76–1.27)
Glucose: 73 mg/dL (ref 70–99)
Potassium: 4.1 mmol/L (ref 3.5–5.2)
Sodium: 142 mmol/L (ref 134–144)
eGFR: 97 mL/min/{1.73_m2} (ref >59)

## 2023-08-27 LAB — AMYLASE: Amylase: 54 U/L (ref 31–110)

## 2023-08-28 ENCOUNTER — Ambulatory Visit
Admission: RE | Admit: 2023-08-28 | Discharge: 2023-08-28 | Disposition: A | Discharge status: Home / Self Care | Payer: 59 | Source: Ambulatory Visit | Attending: Family Medicine | Admitting: Family Medicine

## 2023-08-28 DIAGNOSIS — R1084 Generalized abdominal pain: Secondary | ICD-10-CM

## 2023-09-12 ENCOUNTER — Encounter (HOSPITAL_BASED_OUTPATIENT_CLINIC_OR_DEPARTMENT_OTHER): Payer: Self-pay | Admitting: Family Medicine

## 2023-10-08 ENCOUNTER — Encounter: Payer: Self-pay | Admitting: Physician Assistant

## 2023-10-08 ENCOUNTER — Ambulatory Visit (INDEPENDENT_AMBULATORY_CARE_PROVIDER_SITE_OTHER): Payer: 59 | Admitting: Physician Assistant

## 2023-10-08 VITALS — BP 174/88 | HR 80 | Temp 98.4°F | Ht 67.0 in | Wt 167.0 lb

## 2023-10-08 DIAGNOSIS — M79671 Pain in right foot: Secondary | ICD-10-CM | POA: Diagnosis not present

## 2023-10-08 DIAGNOSIS — I700 Atherosclerosis of aorta: Secondary | ICD-10-CM | POA: Insufficient documentation

## 2023-10-08 DIAGNOSIS — I10 Essential (primary) hypertension: Secondary | ICD-10-CM

## 2023-10-08 DIAGNOSIS — F17200 Nicotine dependence, unspecified, uncomplicated: Secondary | ICD-10-CM | POA: Insufficient documentation

## 2023-10-08 MED ORDER — AMLODIPINE BESYLATE 10 MG PO TABS: 10.0000 mg | ORAL_TABLET | Freq: Every day | ORAL | 1 refills | Status: DC

## 2023-10-08 NOTE — Patient Instructions (Signed)
-  It was a pleasure to see you today! Please review your visit summary for helpful information -I would encourage you to follow your care via MyChart where you can access lab results, notes, messages, and more -If you feel that we did a nice job today, please complete your after-visit survey and leave [place] a Google review! Your CMA today was Mariann Barter and your provider was Alvester Morin, PA-C, DMSc -Please return for follow-up in about 3 weeks for routine physical

## 2023-10-08 NOTE — Progress Notes (Signed)
Date:  10/08/2023   Name:  Javid Imperatore   DOB:  [birth year]   MRN:  [id number]   Chief Complaint: Establish Care and Foot Pain (X1-2 years,Right foot, getting worse, aching, 7 pain score, painful to touch, hurts when walking, has a bunion on the bottom of foot, pt tried to cut it at home, not getting bigger in size)  HPI Billyjo is a pleasant 62 year old male with a history of untreated HTN new to the practice today to establish care.  Historically he does not utilize primary care.  Recently established at our clinic at [facility] 08/26/2023 but would like to transfer care here since I also take care of his wife Misty Stanley.  Labs from that time all normal including BMP, A1c, CBC, lipase, amylase.   Reports that doctors always tell him he has high blood pressure, but he says this is due to whitecoat hypertension.  He feels confident that blood pressure drops when he leaves the office, but has never actually checked outside the doctor's office.  He does have a blood pressure cuff at home which his wife uses.  Complains of lesions on the right foot causing pain.  He has never seen podiatry.   Medication list has been reviewed and updated.  Current Meds  Medication Sig   amLODipine (NORVASC) 10 MG tablet Take 1 tablet (10 mg total) by mouth daily.     Review of Systems  Patient Active Problem List   Diagnosis Date Noted   Aortic atherosclerosis (HCC) 10/08/2023   Tobacco use disorder 10/08/2023   Generalized abdominal pain 08/26/2023   Primary hypertension 08/26/2023   Right upper quadrant abdominal tenderness without rebound tenderness 08/26/2023    No Known Allergies  Immunization History  Administered Date(s) Administered   Influenza-Unspecified 07/24/2023   PFIZER(Purple Top)SARS-COV-2 Vaccination 07/24/2023   Tdap 09/06/2021    Past Surgical History:  Procedure Laterality Date   BACK SURGERY     SPINE SURGERY     Titanium Rod    Social History   Tobacco Use    Smoking status: Every Day    Current packs/day: 0.75    Average packs/day: 0.7 packs/day for 43.0 years (32.2 ttl pk-yrs)    Types: Cigarettes    Start date: 1982   Smokeless tobacco: Never  Vaping Use   Vaping status: Never Used  Substance Use Topics   Alcohol use: Yes    Alcohol/week: 6.0 standard drinks of alcohol    Types: 6 Shots of liquor per week   Drug use: Never    Family History  Problem Relation Age of Onset   Diabetes Mother         10/08/2023    9:29 AM  GAD 7 : Generalized Anxiety Score  Nervous, Anxious, on Edge 0  Control/stop worrying 0  Worry too much - different things 0  Trouble relaxing 0  Restless 0  Easily annoyed or irritable 0  Afraid - awful might happen 0  Total GAD 7 Score 0  Anxiety Difficulty Not difficult at all       10/08/2023    9:29 AM  Depression screen PHQ 2/9  Decreased Interest 0  Down, Depressed, Hopeless 0  PHQ - 2 Score 0  Altered sleeping 0  Tired, decreased energy 0  Change in appetite 0  Feeling bad or failure about yourself  0  Trouble concentrating 0  Moving slowly or fidgety/restless 0  Suicidal thoughts 0  PHQ-9 Score 0  Difficult doing work/chores  Not difficult at all    BP Readings from Last 3 Encounters:  10/08/23 (!) 174/88  08/26/23 (!) 175/86  08/20/23 (!) 178/85    Wt Readings from Last 3 Encounters:  10/08/23 167 lb (75.8 kg)  08/26/23 165 lb (74.8 kg)  08/16/23 172 lb (78 kg)    BP (!) 174/88 (BP Location: Left Arm, Patient Position: Sitting, Cuff Size: Normal)   Pulse 80   Temp 98.4 F (36.9 C) (Oral)   Ht 5\' 7"  (1.702 m)   Wt 167 lb (75.8 kg)   SpO2 98%   BMI 26.16 kg/m   Physical Exam Vitals and nursing note reviewed.  Constitutional:      Appearance: Normal appearance.  Neck:     Vascular: No carotid bruit.  Cardiovascular:     Rate and Rhythm: Normal rate and regular rhythm.     Heart sounds: No murmur heard.    No friction rub. No gallop.  Pulmonary:     Effort:  Pulmonary effort is normal.     Breath sounds: Normal breath sounds.  Abdominal:     General: There is no distension.  Musculoskeletal:        General: Normal range of motion.       Feet:  Feet:     Comments: 2 primary lesions of the right foot which I believe to be corns, less likely plantar wart.  He also has scattered dry/scabbed skin on the medial arch and ankle.  Left foot not examined today. Skin:    General: Skin is warm and dry.  Neurological:     Mental Status: He is alert and oriented to person, place, and time.     Gait: Gait is intact.  Psychiatric:        Mood and Affect: Mood and affect normal.     Recent Labs     Component Value Date/Time   NA 142 08/26/2023 1008   K 4.1 08/26/2023 1008   CL 104 08/26/2023 1008   CO2 20 08/26/2023 1008   GLUCOSE 73 08/26/2023 1008   GLUCOSE 119 (H) 08/19/2023 2013   BUN 12 08/26/2023 1008   CREATININE 0.90 08/26/2023 1008   CALCIUM 9.4 08/26/2023 1008   PROT 7.8 08/19/2023 2013   ALBUMIN 4.4 08/19/2023 2013   AST 20 08/19/2023 2013   ALT 17 08/19/2023 2013   ALKPHOS 67 08/19/2023 2013   BILITOT 0.9 08/19/2023 2013   GFRNONAA >60 08/19/2023 2013    Lab Results  Component Value Date   WBC 7.0 08/19/2023   HGB 15.8 08/19/2023   HCT 47.8 08/19/2023   MCV 101.1 (H) 08/19/2023   PLT 180 08/19/2023   Lab Results  Component Value Date   HGBA1C 5.5 08/26/2023   No results found for: "CHOL", "HDL", "LDLCALC", "LDLDIRECT", "TRIG", "CHOLHDL" No results found for: "TSH"   Assessment and Plan:  1. Primary hypertension (Primary) Discussed with patient he has a long history of very high blood pressure readings as various clinics over the last several years and this should not be ignored.  Borderline hypertensive urgency today.  There may be a whitecoat hypertension component, but regardless he needs better blood pressure control.  Will begin amlodipine 10 mg daily with home blood pressure monitoring and a log for my review  next visit. - amLODipine (NORVASC) 10 MG tablet; Take 1 tablet (10 mg total) by mouth daily.  Dispense: 30 tablet; Refill: 1  2. Tobacco use disorder Discussed the adverse effect of cigarette smoking on  blood pressure and general health.  Patient is precontemplative.  3. Aortic atherosclerosis (HCC) Smoking cessation advised.  Would likely benefit from statin therapy.  Will first obtain lipids at our annual physical next month, assess ASCVD, and continue discussion from there.  4. Right foot pain Referring to podiatry for management of suspected corns of right foot.  - Ambulatory referral to Podiatry   Return in about 3 weeks (around 10/29/2023) for nonfasting CPE.    Alvester Morin, PA-C, DMSc, Nutritionist [facility] Primary Care and Sports Medicine [facility] [phone]

## 2023-10-25 ENCOUNTER — Encounter: Payer: Self-pay | Admitting: Podiatry

## 2023-10-25 ENCOUNTER — Ambulatory Visit: Payer: 59

## 2023-10-25 ENCOUNTER — Ambulatory Visit (INDEPENDENT_AMBULATORY_CARE_PROVIDER_SITE_OTHER): Payer: 59 | Admitting: Podiatry

## 2023-10-25 DIAGNOSIS — L989 Disorder of the skin and subcutaneous tissue, unspecified: Secondary | ICD-10-CM | POA: Diagnosis not present

## 2023-10-25 DIAGNOSIS — M7741 Metatarsalgia, right foot: Secondary | ICD-10-CM

## 2023-10-25 DIAGNOSIS — R269 Unspecified abnormalities of gait and mobility: Secondary | ICD-10-CM

## 2023-10-25 NOTE — Progress Notes (Signed)
   Chief Complaint  Patient presents with   Callouses    I have a corn or callus on the bottom of my foot. N - calluses L - plantar 1st and 5th met D - 5 years O - gradually worse C - tender, sore A - walking T - medicated corn pads, file them down    Subjective: 63 y.o. male presenting to the office today for evaluation of pain and tenderness associated to the right forefoot for several years.  History of spinal surgery affecting his gait.  He compensates and applies pressure to the right foot.  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   Past Medical History:  Diagnosis Date   Hypertension     Past Surgical History:  Procedure Laterality Date   BACK SURGERY     SPINE SURGERY     Titanium Rod    No Known Allergies   Objective:  Physical Exam General: Alert and oriented x3 in no acute distress  Dermatology: Hyperkeratotic lesion(s) present on the plantar aspect of the first and fifth MTP right foot.. Pain on palpation with a central nucleated core noted. Skin is warm, dry and supple bilateral lower extremities. Negative for open lesions or macerations.  Vascular: Palpable pedal pulses bilaterally. No edema or erythema noted. Capillary refill within normal limits.  Neurological: Grossly intact via light touch  Musculoskeletal Exam: Pain on palpation at the keratotic lesion(s) noted. Range of motion within normal limits bilateral. Muscle strength 5/5 in all groups bilateral.  Assessment: 1.  Altered gait secondary to spinal surgery roughly 8 years ago 2.  Symptomatic skin lesions 1st and 5th plantar MTP    Plan of Care:  -Patient evaluated -Excisional debridement of keratoic lesion(s) using a chisel blade was performed without incident.  - Recommend that the best long-term solution for the patient will be custom orthotics to offload pressure from the first and fifth MTP and support the medial longitudinal arch of the foot.  Patient  agrees. -Appointment with orthotics department for custom orthotics.  Order placed -In the meantime metatarsal pads were provided for the patient to apply to the insoles of his shoes -Return to clinic with me as needed.  Thresa EMERSON Sar, DPM [facility]  Dr. Thresa EMERSON Sar, DPM    [address] [place], [place] [address]                Office [phone]  Fax [phone]

## 2023-10-29 ENCOUNTER — Encounter: Payer: Self-pay | Admitting: Physician Assistant

## 2023-10-29 ENCOUNTER — Ambulatory Visit (INDEPENDENT_AMBULATORY_CARE_PROVIDER_SITE_OTHER): Payer: 59 | Admitting: Physician Assistant

## 2023-10-29 VITALS — BP 160/86 | HR 68 | Temp 98.2°F | Ht 67.0 in | Wt 168.0 lb

## 2023-10-29 DIAGNOSIS — Z23 Encounter for immunization: Secondary | ICD-10-CM

## 2023-10-29 DIAGNOSIS — F17200 Nicotine dependence, unspecified, uncomplicated: Secondary | ICD-10-CM | POA: Diagnosis not present

## 2023-10-29 DIAGNOSIS — Z122 Encounter for screening for malignant neoplasm of respiratory organs: Secondary | ICD-10-CM

## 2023-10-29 DIAGNOSIS — Z0000 Encounter for general adult medical examination without abnormal findings: Secondary | ICD-10-CM

## 2023-10-29 DIAGNOSIS — I700 Atherosclerosis of aorta: Secondary | ICD-10-CM

## 2023-10-29 DIAGNOSIS — I10 Essential (primary) hypertension: Secondary | ICD-10-CM | POA: Diagnosis not present

## 2023-10-29 DIAGNOSIS — Z1211 Encounter for screening for malignant neoplasm of colon: Secondary | ICD-10-CM

## 2023-10-29 DIAGNOSIS — Z125 Encounter for screening for malignant neoplasm of prostate: Secondary | ICD-10-CM

## 2023-10-29 DIAGNOSIS — Z114 Encounter for screening for human immunodeficiency virus [HIV]: Secondary | ICD-10-CM

## 2023-10-29 DIAGNOSIS — Z13220 Encounter for screening for lipoid disorders: Secondary | ICD-10-CM

## 2023-10-29 NOTE — Patient Instructions (Signed)
-  It was a pleasure to see you today! Please review your visit summary for helpful information -Lab results are usually available within 1-2 days and we will call once reviewed -I would encourage you to follow your care via MyChart where you can access lab results, notes, messages, and more -If you feel that we did a nice job today, please complete your after-visit survey and leave us a Google review! Your CMA today was Kieandra and your provider was Dan Waddell, PA-C, DMSc -Please return for follow-up in about 2 months  

## 2023-10-29 NOTE — Progress Notes (Signed)
 Date:  10/29/2023   Name:  Jeffrey Figueroa   DOB:  February 03, 1961   MRN:  [id number]   Chief Complaint: Annual Exam  HPI Jeffrey Figueroa presents today for routine physical and 1 month follow-up on HTN after starting amlodipine  10 mg last visit.  Jeffrey Figueroa reports poor medication compliance, no side effects reported but insists Jeffrey Figueroa just does not like medications.  Jeffrey Figueroa is joined by Jeffrey Figueroa wife Jeffrey Figueroa today who reports Jeffrey Figueroa has been having home blood pressure readings in the 130s and occasionally 140s systolic, diastolics typically 70s-80s.    Jeffrey Figueroa is due for routine labs including lipids, especially considering Jeffrey Figueroa recent finding of aortic atherosclerosis on imaging and current tobacco use.  Last Physical: unk Last Dental Exam: <1y ago, has upper/lower dentures Last Eye Exam: <1y ago Last CRC screen: never Last LDCT: never Last PSA: unk   Medication list has been reviewed and updated.  Current Meds  Medication Sig   amLODipine  (NORVASC ) 10 MG tablet Take 1 tablet (10 mg total) by mouth daily.     Review of Systems  Constitutional:  Negative for activity change, appetite change, fatigue and unexpected weight change.  HENT:  Negative for dental problem, hearing loss and trouble swallowing.   Eyes:  Negative for visual disturbance.  Respiratory:  Negative for cough, chest tightness, shortness of breath and wheezing.   Cardiovascular:  Negative for chest pain, palpitations and leg swelling.  Gastrointestinal:  Negative for abdominal distention, abdominal pain, blood in stool, constipation and diarrhea.  Endocrine: Negative for polydipsia, polyphagia and polyuria.  Genitourinary:  Negative for difficulty urinating, dysuria, enuresis, frequency, hematuria and testicular pain.  Musculoskeletal:  Negative for arthralgias, gait problem and joint swelling.  Skin:  Negative for rash and wound.  Neurological:  Negative for dizziness, syncope, weakness and headaches.  Psychiatric/Behavioral:  Negative for behavioral  problems and sleep disturbance.     Jeffrey Figueroa Active Problem List   Diagnosis Date Noted   Aortic atherosclerosis (HCC) 10/08/2023   Tobacco use disorder 10/08/2023   Generalized abdominal pain 08/26/2023   Primary hypertension 08/26/2023   Right upper quadrant abdominal tenderness without rebound tenderness 08/26/2023    No Known Allergies  Immunization History  Administered Date(s) Administered   Influenza-Unspecified 07/24/2023   PFIZER(Purple Top)SARS-COV-2 Vaccination 07/24/2023   Tdap 09/06/2021    Past Surgical History:  Procedure Laterality Date   BACK SURGERY     SPINE SURGERY     Titanium Rod    Social History   Tobacco Use   Smoking status: Every Day    Current packs/day: 0.75    Average packs/day: 0.8 packs/day for 43.0 years (32.3 ttl pk-yrs)    Types: Cigarettes    Start date: 1982   Smokeless tobacco: Never  Vaping Use   Vaping status: Never Used  Substance Use Topics   Alcohol use: Yes    Alcohol/week: 4.0 standard drinks of alcohol    Types: 4 Shots of liquor per week    Comment: daily   Drug use: Never    Family History  Problem Relation Age of Onset   Diabetes Mother         10/29/2023    9:56 AM 10/08/2023    9:29 AM  GAD 7 : Generalized Anxiety Score  Nervous, Anxious, on Edge 0 0  Control/stop worrying 0 0  Worry too much - different things 0 0  Trouble relaxing 0 0  Restless 0 0  Easily annoyed or irritable 0 0  Afraid -  awful might happen 0 0  Total GAD 7 Score 0 0  Anxiety Difficulty Not difficult at all Not difficult at all       10/29/2023    9:56 AM 10/08/2023    9:29 AM  Depression screen PHQ 2/9  Decreased Interest 0 0  Down, Depressed, Hopeless 0 0  PHQ - 2 Score 0 0  Altered sleeping 0 0  Tired, decreased energy 0 0  Change in appetite 0 0  Feeling bad or failure about yourself  0 0  Trouble concentrating 0 0  Moving slowly or fidgety/restless 0 0  Suicidal thoughts 0 0  PHQ-9 Score 0 0  Difficult doing  work/chores Not difficult at all Not difficult at all    BP Readings from Last 3 Encounters:  10/29/23 (!) 160/86  10/08/23 (!) 174/88  08/26/23 (!) 175/86    Wt Readings from Last 3 Encounters:  10/29/23 168 lb (76.2 kg)  10/08/23 167 lb (75.8 kg)  08/26/23 165 lb (74.8 kg)    BP (!) 160/86 (BP Location: Left Arm, Jeffrey Figueroa Position: Sitting, Cuff Size: Normal)   Pulse 68   Temp 98.2 F (36.8 C) (Oral)   Ht 5' 7 (1.702 m)   Wt 168 lb (76.2 kg)   SpO2 97%   BMI 26.31 kg/m   Physical Exam Vitals and nursing note reviewed.  Constitutional:      Appearance: Normal appearance.  HENT:     Ears:     Comments: EAC clear bilaterally with good view of TM which is without effusion or erythema.     Nose: Nose normal.     Mouth/Throat:     Mouth: Mucous membranes are moist. No oral lesions.     Dentition: Normal dentition.     Pharynx: No posterior oropharyngeal erythema.  Eyes:     Extraocular Movements: Extraocular movements intact.     Conjunctiva/sclera: Conjunctivae normal.     Pupils: Pupils are equal, round, and reactive to light.  Neck:     Thyroid: No thyromegaly.  Cardiovascular:     Rate and Rhythm: Normal rate and regular rhythm.     Heart sounds: No murmur heard.    No friction rub. No gallop.     Comments: Pulses 2+ at radial, PT, DP bilaterally. No carotid bruit. No peripheral edema Pulmonary:     Effort: Pulmonary effort is normal.     Breath sounds: Decreased breath sounds present. No wheezing, rhonchi or rales.  Abdominal:     General: Bowel sounds are normal.     Palpations: Abdomen is soft. There is no mass.     Tenderness: There is no abdominal tenderness.  Genitourinary:    Comments: Genital exam deferred.  DRE reveals normal rectal tone and nontender borderline enlarged prostate, smooth, perhaps slightly larger on the left lobe. Musculoskeletal:     Comments: Full ROM with strength 5/5 bilateral upper and lower extremities  Lymphadenopathy:      Cervical: No cervical adenopathy.  Skin:    General: Skin is warm.     Capillary Refill: Capillary refill takes less than 2 seconds.     Findings: No lesion or rash.     Comments: On the left posterior upper arm there is a dark macule measuring 5-6 mm with hexagonal shape but well-defined borders; Jeffrey Figueroa reports stable for years.  Neurological:     Mental Status: Jeffrey Figueroa is alert and oriented to person, place, and time.     Gait: Gait is intact.  Psychiatric:        Mood and Affect: Mood normal.        Behavior: Behavior normal.     Recent Labs     Component Value Date/Time   NA 142 08/26/2023 1008   K 4.1 08/26/2023 1008   CL 104 08/26/2023 1008   CO2 20 08/26/2023 1008   GLUCOSE 73 08/26/2023 1008   GLUCOSE 119 (H) 08/19/2023 2013   BUN 12 08/26/2023 1008   CREATININE 0.90 08/26/2023 1008   CALCIUM  9.4 08/26/2023 1008   PROT 7.8 08/19/2023 2013   ALBUMIN 4.4 08/19/2023 2013   AST 20 08/19/2023 2013   ALT 17 08/19/2023 2013   ALKPHOS 67 08/19/2023 2013   BILITOT 0.9 08/19/2023 2013   GFRNONAA >60 08/19/2023 2013    Lab Results  Component Value Date   WBC 7.0 08/19/2023   HGB 15.8 08/19/2023   HCT 47.8 08/19/2023   MCV 101.1 (H) 08/19/2023   PLT 180 08/19/2023   Lab Results  Component Value Date   HGBA1C 5.5 08/26/2023   No results found for: CHOL, HDL, LDLCALC, LDLDIRECT, TRIG, CHOLHDL No results found for: TSH   Assessment and Plan:  1. Annual physical exam (Primary) Seemingly healthy Jeffrey Figueroa with minor abnormalities on exam. Encouraged healthy lifestyle including regular physical activity and consumption of whole fruits and vegetables. Encouraged routine dental and eye exams. Vaccinations up to date.   Checking nonfasting routine labs today.  Jeffrey Figueroa is due for screenings for prostate, lung, and colon cancer.  Abdominal ultrasound completed 08/28/2023 negative for aneurysm.  - PSA - Lipid panel - CBC with Differential/Platelet - Comprehensive  metabolic panel - TSH - HIV Antibody (routine testing w rflx) - Hepatitis C antibody - Ambulatory Referral Lung Cancer Screening [facility]  2. Primary hypertension Elevated x 2 today, Jeffrey Figueroa did not take Jeffrey Figueroa medication this morning.  Emphasized the importance of medication compliance for blood pressure control.  Jeffrey Figueroa verbalizes understanding and will try to take amlodipine  daily as prescribed with a blood pressure log for my review next time. - Lipid panel - CBC with Differential/Platelet - Comprehensive metabolic panel - TSH  3. Tobacco use disorder Again encouraged cessation, Jeffrey Figueroa is precontemplative.  Discussed physical exam findings suggestive of COPD, which was also suggested on Jeffrey Figueroa CT cervical spine from 09/06/2021.  Discussed the best treatment for this would be smoking cessation, but Jeffrey Figueroa is not quite ready.  4. Aortic atherosclerosis (HCC) Check nonfasting lipids today to appropriately estimate ASCVD.  Jeffrey Figueroa would almost definitely benefit from statin therapy, but there is concern for medication compliance. - Lipid panel  5. Need for shingles vaccine Briefly discussed due for Shingrix.  Jeffrey Figueroa would like to proceed with immunization, but at a later date when Jeffrey Figueroa has the weekend to recover.  Plan for nurse visit at Jeffrey Figueroa convenience  6. Screening for hyperlipidemia - Lipid panel  7. Screening for HIV (human immunodeficiency virus) - HIV Antibody (routine testing w rflx)  8. Screening for colon cancer Jeffrey Figueroa due for initial CRC screening.  Hemoccult negative today.  Due to increased risk with longstanding smoking history and delayed onset of screening, advised Jeffrey Figueroa to proceed with colonoscopy.  Jeffrey Figueroa is in agreement, referring today. - Ambulatory referral to Gastroenterology  9. Screening for lung cancer Referring for LDCT - Ambulatory Referral Lung Cancer Screening [facility]  10. Screening PSA (prostate specific antigen) Borderline enlarged prostate on  exam, no LUTS reported.  Check PSA. - PSA      Return  in about 2 months (around 12/27/2023) for OV f/u HTN.    Rolan Hoyle, PA-C, DMSc, Nutritionist [facility] Primary Care and Sports Medicine [facility] [phone]

## 2023-10-30 ENCOUNTER — Other Ambulatory Visit: Payer: Self-pay | Admitting: Physician Assistant

## 2023-10-30 DIAGNOSIS — I700 Atherosclerosis of aorta: Secondary | ICD-10-CM

## 2023-10-30 LAB — LIPID PANEL
Chol/HDL Ratio: 2.4 {ratio} (ref 0.0–5.0)
Cholesterol, Total: 175 mg/dL (ref 100–199)
HDL: 72 mg/dL (ref >39)
LDL Chol Calc (NIH): 80 mg/dL (ref 0–99)
Triglycerides: 137 mg/dL (ref 0–149)
VLDL Cholesterol Cal: 23 mg/dL (ref 5–40)

## 2023-10-30 LAB — COMPREHENSIVE METABOLIC PANEL
ALT: 18 [IU]/L (ref 0–44)
AST: 19 [IU]/L (ref 0–40)
Albumin: 4.4 g/dL (ref 3.9–4.9)
Alkaline Phosphatase: 94 [IU]/L (ref 44–121)
BUN/Creatinine Ratio: 13 (ref 10–24)
BUN: 14 mg/dL (ref 8–27)
Bilirubin Total: 0.5 mg/dL (ref 0.0–1.2)
CO2: 23 mmol/L (ref 20–29)
Calcium: 9.7 mg/dL (ref 8.6–10.2)
Chloride: 104 mmol/L (ref 96–106)
Creatinine, Ser: 1.05 mg/dL (ref 0.76–1.27)
Globulin, Total: 2.9 g/dL (ref 1.5–4.5)
Glucose: 89 mg/dL (ref 70–99)
Potassium: 4.3 mmol/L (ref 3.5–5.2)
Sodium: 141 mmol/L (ref 134–144)
Total Protein: 7.3 g/dL (ref 6.0–8.5)
eGFR: 80 mL/min/{1.73_m2} (ref >59)

## 2023-10-30 LAB — CBC WITH DIFFERENTIAL/PLATELET
Basophils Absolute: 0.1 10*3/uL (ref 0.0–0.2)
Basos: 1 %
EOS (ABSOLUTE): 0.3 10*3/uL (ref 0.0–0.4)
Eos: 4 %
Hematocrit: 48.6 % (ref 37.5–51.0)
Hemoglobin: 15.7 g/dL (ref 13.0–17.7)
Immature Grans (Abs): 0 10*3/uL (ref 0.0–0.1)
Immature Granulocytes: 0 %
Lymphocytes Absolute: 3.2 10*3/uL — ABNORMAL HIGH (ref 0.7–3.1)
Lymphs: 40 %
MCH: 33.3 pg — ABNORMAL HIGH (ref 26.6–33.0)
MCHC: 32.3 g/dL (ref 31.5–35.7)
MCV: 103 fL — ABNORMAL HIGH (ref 79–97)
Monocytes Absolute: 0.6 10*3/uL (ref 0.1–0.9)
Monocytes: 7 %
Neutrophils Absolute: 4 10*3/uL (ref 1.4–7.0)
Neutrophils: 48 %
Platelets: 226 10*3/uL (ref 150–450)
RBC: 4.72 x10E6/uL (ref 4.14–5.80)
RDW: 11.7 % (ref 11.6–15.4)
WBC: 8.1 10*3/uL (ref 3.4–10.8)

## 2023-10-30 LAB — PSA: Prostate Specific Ag, Serum: 0.7 ng/mL (ref 0.0–4.0)

## 2023-10-30 LAB — TSH: TSH: 1.45 u[IU]/mL (ref 0.450–4.500)

## 2023-10-30 LAB — HEPATITIS C ANTIBODY: Hep C Virus Ab: NONREACTIVE

## 2023-10-30 LAB — HIV ANTIBODY (ROUTINE TESTING W REFLEX): HIV Screen 4th Generation wRfx: NONREACTIVE

## 2023-10-30 MED ORDER — ROSUVASTATIN CALCIUM 5 MG PO TABS: 5.0000 mg | ORAL_TABLET | Freq: Every day | ORAL | 1 refills | Status: DC

## 2023-10-31 LAB — SPECIMEN STATUS REPORT

## 2023-10-31 LAB — B12 AND FOLATE PANEL
Folate: 9.2 ng/mL (ref >3.0)
Vitamin B-12: 399 pg/mL (ref 232–1245)

## 2023-11-07 ENCOUNTER — Other Ambulatory Visit: Payer: Self-pay | Admitting: *Deleted

## 2023-11-07 ENCOUNTER — Telehealth: Payer: Self-pay

## 2023-11-07 DIAGNOSIS — Z87891 Personal history of nicotine dependence: Secondary | ICD-10-CM

## 2023-11-07 DIAGNOSIS — F17210 Nicotine dependence, cigarettes, uncomplicated: Secondary | ICD-10-CM

## 2023-11-07 DIAGNOSIS — Z122 Encounter for screening for malignant neoplasm of respiratory organs: Secondary | ICD-10-CM

## 2023-11-07 NOTE — Telephone Encounter (Signed)
Pt requesting call back to schedule colonoscopy.

## 2023-11-07 NOTE — Telephone Encounter (Signed)
okay

## 2023-11-13 NOTE — Telephone Encounter (Signed)
The patient wife Misty Stanley) called in to schedule his colonoscopy.

## 2023-11-14 ENCOUNTER — Encounter: Payer: Self-pay | Admitting: *Deleted

## 2023-11-26 ENCOUNTER — Encounter: Payer: 59 | Admitting: Acute Care

## 2023-11-27 ENCOUNTER — Inpatient Hospital Stay: Admission: RE | Admit: 2023-11-27 | Payer: 59 | Source: Ambulatory Visit

## 2023-11-29 ENCOUNTER — Other Ambulatory Visit: Payer: 59

## 2023-12-27 ENCOUNTER — Ambulatory Visit: Payer: Self-pay | Admitting: Physician Assistant

## 2024-03-09 ENCOUNTER — Ambulatory Visit (INDEPENDENT_AMBULATORY_CARE_PROVIDER_SITE_OTHER): Admitting: Physician Assistant

## 2024-03-09 ENCOUNTER — Encounter: Payer: Self-pay | Admitting: Physician Assistant

## 2024-03-09 ENCOUNTER — Encounter: Payer: Self-pay | Admitting: *Deleted

## 2024-03-09 VITALS — BP 126/72 | HR 102 | Temp 99.5°F | Resp 18 | Ht 67.0 in | Wt 156.4 lb

## 2024-03-09 DIAGNOSIS — R1013 Epigastric pain: Secondary | ICD-10-CM | POA: Diagnosis not present

## 2024-03-09 DIAGNOSIS — R110 Nausea: Secondary | ICD-10-CM

## 2024-03-09 MED ORDER — ONDANSETRON 8 MG PO TBDP: 8.0000 mg | ORAL_TABLET | Freq: Three times a day (TID) | ORAL | 0 refills | Status: DC | PRN

## 2024-03-09 NOTE — Progress Notes (Signed)
 Date:  03/09/2024   Name:  Jeffrey Figueroa   DOB:  08/24/1961   MRN:  [id number]   Chief Complaint: Fever (Started Saturday, max T 100.5) and Abdominal Pain (Right lower quadrant pain)  HPI Jeffrey Figueroa presents for evaluation of acute abdominal pain for the last 48h with associated nausea, mild fever, and dry heaves. No bowel movements since Friday which he states he is not surprised by since he has not hardly eaten anything since then. He has been drinking plenty of water but his urine remains dark yellow. Denies dysuria or hematuria.   Appendectomy age 70y.   Significant alcohol use, typically 4-5 drinks per day but has not had any alcohol since Friday.    Medication list has been reviewed and updated.  Current Meds  Medication Sig   amLODipine  (NORVASC ) 10 MG tablet Take 1 tablet (10 mg total) by mouth daily.   ondansetron  (ZOFRAN -ODT) 8 MG disintegrating tablet Take 1 tablet (8 mg total) by mouth every 8 (eight) hours as needed for nausea or vomiting.   rosuvastatin  (CRESTOR ) 5 MG tablet Take 1 tablet (5 mg total) by mouth daily.     Review of Systems  Patient Active Problem List   Diagnosis Date Noted   Aortic atherosclerosis (HCC) 10/08/2023   Tobacco use disorder 10/08/2023   Generalized abdominal pain 08/26/2023   Primary hypertension 08/26/2023   Right upper quadrant abdominal tenderness without rebound tenderness 08/26/2023    No Known Allergies  Immunization History  Administered Date(s) Administered   Influenza-Unspecified 07/24/2023   PFIZER(Purple Top)SARS-COV-2 Vaccination 07/24/2023   Tdap 09/06/2021    Past Surgical History:  Procedure Laterality Date   BACK SURGERY     SPINE SURGERY     Titanium Rod    Social History   Tobacco Use   Smoking status: Every Day    Current packs/day: 0.75    Average packs/day: 0.8 packs/day for 43.4 years (32.5 ttl pk-yrs)    Types: Cigarettes    Start date: 1982   Smokeless tobacco: Never  Vaping Use   Vaping  status: Never Used  Substance Use Topics   Alcohol use: Yes    Alcohol/week: 25.0 standard drinks of alcohol    Types: 25 Shots of liquor per week    Comment: daily   Drug use: Never    Family History  Problem Relation Age of Onset   Diabetes Mother         10/29/2023    9:56 AM 10/08/2023    9:29 AM  GAD 7 : Generalized Anxiety Score  Nervous, Anxious, on Edge 0 0  Control/stop worrying 0 0  Worry too much - different things 0 0  Trouble relaxing 0 0  Restless 0 0  Easily annoyed or irritable 0 0  Afraid - awful might happen 0 0  Total GAD 7 Score 0 0  Anxiety Difficulty Not difficult at all Not difficult at all       10/29/2023    9:56 AM 10/08/2023    9:29 AM  Depression screen PHQ 2/9  Decreased Interest 0 0  Down, Depressed, Hopeless 0 0  PHQ - 2 Score 0 0  Altered sleeping 0 0  Tired, decreased energy 0 0  Change in appetite 0 0  Feeling bad or failure about yourself  0 0  Trouble concentrating 0 0  Moving slowly or fidgety/restless 0 0  Suicidal thoughts 0 0  PHQ-9 Score 0 0  Difficult doing work/chores Not difficult at  all Not difficult at all    BP Readings from Last 3 Encounters:  03/09/24 126/72  10/29/23 (!) 160/86  10/08/23 (!) 174/88    Wt Readings from Last 3 Encounters:  03/09/24 156 lb 6.4 oz (70.9 kg)  10/29/23 168 lb (76.2 kg)  10/08/23 167 lb (75.8 kg)    BP 126/72 (BP Location: Left Arm, Patient Position: Sitting)   Pulse (!) 102   Temp 99.5 F (37.5 C) (Oral)   Resp 18   Ht 5\' 7"  (1.702 m)   Wt 156 lb 6.4 oz (70.9 kg)   SpO2 97%   BMI 24.50 kg/m   Physical Exam Vitals and nursing note reviewed.  Constitutional:      Appearance: Normal appearance.  Cardiovascular:     Rate and Rhythm: Regular rhythm. Tachycardia present.     Heart sounds: No murmur heard.    No friction rub. No gallop.  Pulmonary:     Effort: Pulmonary effort is normal.     Breath sounds: Normal breath sounds.  Abdominal:     General: Bowel sounds  are normal. There is no distension.     Palpations: Abdomen is soft.     Tenderness: There is abdominal tenderness in the epigastric area. There is right CVA tenderness. There is no left CVA tenderness.  Musculoskeletal:        General: Normal range of motion.  Skin:    General: Skin is warm and dry.  Neurological:     Mental Status: He is alert and oriented to person, place, and time.     Gait: Gait is intact.  Psychiatric:        Mood and Affect: Mood and affect normal.     Recent Labs     Component Value Date/Time   NA 141 10/29/2023 1056   K 4.3 10/29/2023 1056   CL 104 10/29/2023 1056   CO2 23 10/29/2023 1056   GLUCOSE 89 10/29/2023 1056   GLUCOSE 119 (H) 08/19/2023 2013   BUN 14 10/29/2023 1056   CREATININE 1.05 10/29/2023 1056   CALCIUM  9.7 10/29/2023 1056   PROT 7.3 10/29/2023 1056   ALBUMIN 4.4 10/29/2023 1056   AST 19 10/29/2023 1056   ALT 18 10/29/2023 1056   ALKPHOS 94 10/29/2023 1056   BILITOT 0.5 10/29/2023 1056   GFRNONAA >60 08/19/2023 2013    Lab Results  Component Value Date   WBC 8.1 10/29/2023   HGB 15.7 10/29/2023   HCT 48.6 10/29/2023   MCV 103 (H) 10/29/2023   PLT 226 10/29/2023   Lab Results  Component Value Date   HGBA1C 5.5 08/26/2023   Lab Results  Component Value Date   CHOL 175 10/29/2023   HDL 72 10/29/2023   LDLCALC 80 10/29/2023   TRIG 137 10/29/2023   CHOLHDL 2.4 10/29/2023   Lab Results  Component Value Date   TSH 1.450 10/29/2023     Assessment and Plan:  1. Acute epigastric abdominal pain (Primary) Suspect viral gastroenteritis. Will check labs today. Less likely pancreatitis, but certainly in the differential with alcohol use. Considered UTI/pyelo with his CVA tenderness but unable to obtain urine today.  Sending disintegrating ondansetron  for nausea control. Advised prioritizing intake of clear or mostly clear fluids such as water, sports drinks, small sips of ginger ale. Eat small amounts of bland foods like  crackers, toast, soup. Slowly advance diet as tolerated.  If symptoms continue to worsen or are otherwise intractable, proceed to ED.   - CBC with  Differential/Platelet - Comprehensive metabolic panel with GFR - Lipase - Amylase - POCT urinalysis dipstick  2. Nausea - ondansetron  (ZOFRAN -ODT) 8 MG disintegrating tablet; Take 1 tablet (8 mg total) by mouth every 8 (eight) hours as needed for nausea or vomiting.  Dispense: 20 tablet; Refill: 0   F/u PRN   Cody Das, PA-C, DMSc, Nutritionist [facility] Primary Care and Sports Medicine [facility] [phone]

## 2024-03-10 ENCOUNTER — Ambulatory Visit: Payer: Self-pay | Admitting: Physician Assistant

## 2024-03-10 ENCOUNTER — Encounter: Payer: Self-pay | Admitting: Physician Assistant

## 2024-03-10 LAB — COMPREHENSIVE METABOLIC PANEL WITH GFR
ALT: 23 IU/L (ref 0–44)
AST: 33 IU/L (ref 0–40)
Albumin: 4.1 g/dL (ref 3.9–4.9)
Alkaline Phosphatase: 74 IU/L (ref 44–121)
BUN/Creatinine Ratio: 14 (ref 10–24)
BUN: 12 mg/dL (ref 8–27)
Bilirubin Total: 0.8 mg/dL (ref 0.0–1.2)
CO2: 18 mmol/L — ABNORMAL LOW (ref 20–29)
Calcium: 9 mg/dL (ref 8.6–10.2)
Chloride: 103 mmol/L (ref 96–106)
Creatinine, Ser: 0.88 mg/dL (ref 0.76–1.27)
Globulin, Total: 3 g/dL (ref 1.5–4.5)
Glucose: 98 mg/dL (ref 70–99)
Potassium: 4.3 mmol/L (ref 3.5–5.2)
Sodium: 138 mmol/L (ref 134–144)
Total Protein: 7.1 g/dL (ref 6.0–8.5)
eGFR: 97 mL/min/1.73

## 2024-03-10 LAB — LIPASE: Lipase: 21 U/L (ref 13–78)

## 2024-03-10 LAB — CBC WITH DIFFERENTIAL/PLATELET
Basophils Absolute: 0.1 10*3/uL (ref 0.0–0.2)
Basos: 2 %
EOS (ABSOLUTE): 0 10*3/uL (ref 0.0–0.4)
Eos: 1 %
Hematocrit: 51.8 % — ABNORMAL HIGH (ref 37.5–51.0)
Hemoglobin: 17.7 g/dL (ref 13.0–17.7)
Immature Grans (Abs): 0 10*3/uL (ref 0.0–0.1)
Immature Granulocytes: 0 %
Lymphocytes Absolute: 2.3 10*3/uL (ref 0.7–3.1)
Lymphs: 53 %
MCH: 34.4 pg — ABNORMAL HIGH (ref 26.6–33.0)
MCHC: 34.2 g/dL (ref 31.5–35.7)
MCV: 101 fL — ABNORMAL HIGH (ref 79–97)
Monocytes Absolute: 0.5 10*3/uL (ref 0.1–0.9)
Monocytes: 12 %
Neutrophils Absolute: 1.4 10*3/uL (ref 1.4–7.0)
Neutrophils: 32 %
Platelets: 145 10*3/uL — ABNORMAL LOW (ref 150–450)
RBC: 5.14 x10E6/uL (ref 4.14–5.80)
RDW: 12.1 % (ref 11.6–15.4)
WBC: 4.3 10*3/uL (ref 3.4–10.8)

## 2024-03-10 LAB — AMYLASE: Amylase: 48 U/L (ref 31–110)

## 2024-03-10 NOTE — Telephone Encounter (Signed)
 Please review and advise.   JM

## 2024-03-11 ENCOUNTER — Encounter: Payer: Self-pay | Admitting: Acute Care

## 2024-03-24 ENCOUNTER — Telehealth: Payer: Self-pay | Admitting: Gastroenterology

## 2024-03-24 NOTE — Telephone Encounter (Signed)
 Patient wife called and stated that her husband appointment has been canceled and would like to know why. I informed that patient of why his appointment was cancelled and advise the patient  wife we can get her husband rescheduled. I advised the patient wife that our soonest availability  was for in July. Patient wife stated that it was unacceptable and hung up the phone. Please advise.

## 2024-03-24 NOTE — Progress Notes (Deleted)
 03/24/2024 Izaak Sahr [id number] 16-Sep-1961   CHIEF COMPLAINT: Abdominal pain, schedule a colonoscopy    HISTORY OF PRESENT ILLNESS: Marice Angelino is a 63 year old male with a past medical history of hypertension and alcohol use disorder. Past appendectomy. He presents to our office todayas referred by Laroy Plunk to schedule a colonoscopy.   Seen by PCP 5/19 acute abdominal pain for the last 48h with associated nausea, mild fever, and dry heaves.      Latest Ref Rng & Units 03/09/2024    4:57 PM 10/29/2023   10:56 AM 08/19/2023    8:13 PM  CBC  WBC 3.4 - 10.8 x10E3/uL 4.3  8.1  7.0   Hemoglobin 13.0 - 17.7 g/dL 40.9  81.1  91.4   Hematocrit 37.5 - 51.0 % 51.8  48.6  47.8   Platelets 150 - 450 x10E3/uL 145  226  180        Latest Ref Rng & Units 03/09/2024    4:57 PM 10/29/2023   10:56 AM 08/26/2023   10:08 AM  CMP  Glucose 70 - 99 mg/dL 98  89  73   BUN 8 - 27 mg/dL 12  14  12    Creatinine 0.76 - 1.27 mg/dL 7.82  9.56  2.13   Sodium 134 - 144 mmol/L 138  141  142   Potassium 3.5 - 5.2 mmol/L 4.3  4.3  4.1   Chloride 96 - 106 mmol/L 103  104  104   CO2 20 - 29 mmol/L 18  23  20    Calcium  8.6 - 10.2 mg/dL 9.0  9.7  9.4   Total Protein 6.0 - 8.5 g/dL 7.1  7.3    Total Bilirubin 0.0 - 1.2 mg/dL 0.8  0.5    Alkaline Phos 44 - 121 IU/L 74  94    AST 0 - 40 IU/L 33  19    ALT 0 - 44 IU/L 23  18      Abdominal sonogram 08/28/2023: FINDINGS: Gallbladder: No gallstones or wall thickening visualized. No sonographic Murphy sign noted by sonographer.   Common bile duct: Diameter: 0.5 cm   Liver: No focal lesion identified. Within normal limits in parenchymal echogenicity. Portal vein is patent on color Doppler imaging with normal direction of blood flow towards the liver.   IVC: No abnormality visualized.   Pancreas: Visualized portion unremarkable.   Spleen: Size and appearance within normal limits.   Right Kidney: Length: 11.4 cm. Echogenicity within normal limits.  No mass or hydronephrosis visualized.   Left Kidney: Length: 11.8 cm. Echogenicity within normal limits. No mass or hydronephrosis visualized. Thinly septated cysts, benign, requiring no further follow-up or characterization.   Abdominal aorta: No aneurysm visualized.   Other findings: Small porta hepatis lymph node anterior to the pancreatic head. Aortic atherosclerosis.   IMPRESSION: No ultrasound findings to explain abdominal pain.       Past Medical History:  Diagnosis Date   Hypertension    Past Surgical History:  Procedure Laterality Date   BACK SURGERY     SPINE SURGERY     Titanium Rod   Social History:  Family History:    reports that he has been smoking cigarettes. He started smoking about 43 years ago. He has a 32.6 pack-year smoking history. He has never used smokeless tobacco. He reports current alcohol use of about 25.0 standard drinks of alcohol per week. He reports that he does not use drugs. family history includes Diabetes  in his mother.  No Known Allergies    Outpatient Encounter Medications as of 03/25/2024  Medication Sig   amLODipine  (NORVASC ) 10 MG tablet Take 1 tablet (10 mg total) by mouth daily.   ondansetron  (ZOFRAN -ODT) 8 MG disintegrating tablet Take 1 tablet (8 mg total) by mouth every 8 (eight) hours as needed for nausea or vomiting.   rosuvastatin  (CRESTOR ) 5 MG tablet Take 1 tablet (5 mg total) by mouth daily.   No facility-administered encounter medications on file as of 03/25/2024.     REVIEW OF SYSTEMS:  Gen: Denies fever, sweats or chills. No weight loss.  CV: Denies chest pain, palpitations or edema. Resp: Denies cough, shortness of breath of hemoptysis.  GI: Denies heartburn, dysphagia, stomach or lower abdominal pain. No diarrhea or constipation.  GU: Denies urinary burning, blood in urine, increased urinary frequency or incontinence. MS: Denies joint pain, muscles aches or weakness. Derm: Denies rash, itchiness, skin lesions  or unhealing ulcers. Psych: Denies depression, anxiety, memory loss or confusion. Heme: Denies bruising, easy bleeding. Neuro:  Denies headaches, dizziness or paresthesias. Endo:  Denies any problems with DM, thyroid or adrenal function.  PHYSICAL EXAM: There were no vitals taken for this visit. General: in no acute distress. Head: Normocephalic and atraumatic. Eyes:  Sclerae non-icteric, conjunctive pink. Ears: Normal auditory acuity. Mouth: Dentition intact. No ulcers or lesions.  Neck: Supple, no lymphadenopathy or thyromegaly.  Lungs: Clear bilaterally to auscultation without wheezes, crackles or rhonchi. Heart: Regular rate and rhythm. No murmur, rub or gallop appreciated.  Abdomen: Soft, nontender, nondistended. No masses. No hepatosplenomegaly. Normoactive bowel sounds x 4 quadrants.  Rectal: Deferred.  Musculoskeletal: Symmetrical with no gross deformities. Skin: Warm and dry. No rash or lesions on visible extremities. Extremities: No edema. Neurological: Alert oriented x 4, no focal deficits.  Psychological: Alert and cooperative. Normal mood and affect.  ASSESSMENT AND PLAN:    CC:  Leopoldo Rancher, PA

## 2024-03-25 ENCOUNTER — Ambulatory Visit: Admitting: Nurse Practitioner

## 2024-06-24 ENCOUNTER — Encounter: Payer: Self-pay | Admitting: Intensive Care

## 2024-06-24 ENCOUNTER — Other Ambulatory Visit: Payer: Self-pay

## 2024-06-24 ENCOUNTER — Telehealth: Payer: Self-pay

## 2024-06-24 ENCOUNTER — Emergency Department: Admission: EM | Admit: 2024-06-24 | Discharge: 2024-06-24 | Disposition: A | Discharge status: Home / Self Care

## 2024-06-24 DIAGNOSIS — I10 Essential (primary) hypertension: Secondary | ICD-10-CM | POA: Diagnosis not present

## 2024-06-24 DIAGNOSIS — R202 Paresthesia of skin: Secondary | ICD-10-CM | POA: Diagnosis present

## 2024-06-24 LAB — CBC WITH DIFFERENTIAL/PLATELET
Abs Immature Granulocytes: 0.02 K/uL (ref 0.00–0.07)
Basophils Absolute: 0.1 K/uL (ref 0.0–0.1)
Basophils Relative: 1 %
Eosinophils Absolute: 0.3 K/uL (ref 0.0–0.5)
Eosinophils Relative: 4 %
HCT: 50.2 % (ref 39.0–52.0)
Hemoglobin: 17.1 g/dL — ABNORMAL HIGH (ref 13.0–17.0)
Immature Granulocytes: 0 %
Lymphocytes Relative: 47 %
Lymphs Abs: 2.8 K/uL (ref 0.7–4.0)
MCH: 33.9 pg (ref 26.0–34.0)
MCHC: 34.1 g/dL (ref 30.0–36.0)
MCV: 99.6 fL (ref 80.0–100.0)
Monocytes Absolute: 0.5 K/uL (ref 0.1–1.0)
Monocytes Relative: 8 %
Neutro Abs: 2.4 K/uL (ref 1.7–7.7)
Neutrophils Relative %: 40 %
Platelets: 207 K/uL (ref 150–400)
RBC: 5.04 MIL/uL (ref 4.22–5.81)
RDW: 12.1 % (ref 11.5–15.5)
WBC: 6 K/uL (ref 4.0–10.5)
nRBC: 0 % (ref 0.0–0.2)

## 2024-06-24 LAB — COMPREHENSIVE METABOLIC PANEL WITH GFR
ALT: 18 U/L (ref 0–44)
AST: 23 U/L (ref 15–41)
Albumin: 3.8 g/dL (ref 3.5–5.0)
Alkaline Phosphatase: 79 U/L (ref 38–126)
Anion gap: 12 (ref 5–15)
BUN: 11 mg/dL (ref 8–23)
CO2: 23 mmol/L (ref 22–32)
Calcium: 9 mg/dL (ref 8.9–10.3)
Chloride: 106 mmol/L (ref 98–111)
Creatinine, Ser: 0.89 mg/dL (ref 0.61–1.24)
GFR, Estimated: >60 mL/min (ref >60)
Glucose, Bld: 110 mg/dL — ABNORMAL HIGH (ref 70–99)
Potassium: 3.8 mmol/L (ref 3.5–5.1)
Sodium: 141 mmol/L (ref 135–145)
Total Bilirubin: 0.6 mg/dL (ref 0.0–1.2)
Total Protein: 7.3 g/dL (ref 6.5–8.1)

## 2024-06-24 MED ORDER — GABAPENTIN 100 MG PO CAPS: 100.0000 mg | ORAL_CAPSULE | Freq: Three times a day (TID) | ORAL | 0 refills | Status: DC

## 2024-06-24 MED ORDER — PREDNISONE 20 MG PO TABS: 60.0000 mg | ORAL_TABLET | Freq: Every day | ORAL | 0 refills | Status: AC

## 2024-06-24 NOTE — Transitions of Care (Post Inpatient/ED Visit) (Unsigned)
   06/24/2024  Name: Jeffrey Figueroa MRN: [id number] DOB: Jan 23, 1961  Today's TOC FU Call Status: Today's TOC FU Call Status:: Unsuccessful Call (1st Attempt) Unsuccessful Call (1st Attempt) Date: 06/24/24  Attempted to reach the patient regarding the most recent Inpatient/ED visit.  Follow Up Plan: Additional outreach attempts will be made to reach the patient to complete the Transitions of Care (Post Inpatient/ED visit) call.   [employer], CMA

## 2024-06-24 NOTE — ED Triage Notes (Signed)
 Patient c/o left facial numbness/stinging and left arm/finger numbness. Also reports extreme pain in left arm. Reports this started weeks ago  Reports spine surgery years ago

## 2024-06-24 NOTE — ED Provider Notes (Signed)
 [facility] Provider Note    Event Date/Time   First MD Initiated Contact with Patient 06/24/24 1338     (approximate)   History   Numbness and Arm Pain   HPI  Jeffrey Figueroa is a 63 y.o. male with a history of hypertension, aortic atherosclerosis, and a remote history of lumbar spinal surgery (the patient describes what was likely a fusion) who presents with paresthesias in the left arm and left side of the face.  These have been occurring intermittently for approximately 1 month, possibly somewhat longer.  There has been no acute change today.  The patient states that he is here primarily because his wife wanted him to get evaluated.  He states that the paresthesias are felt in the left arm and hand.  Usually it is positional.  For example, when he drives and puts his left arm up on the steering wheel he will gradually start to feel the paresthesias come on.  They do radiate up into the left side of his neck and into his left face.  He denies any weakness in the left arm.  He has no paresthesias in the left side of his torso or left leg.  He has no weakness or difficulty walking (he reports a limp from his prior back surgery but this is unchanged).  He denies any changes in his vision.  He has no facial droop.  He has no difficulty speaking or swallowing.  I reviewed the past medical records.  The patient's most recent outpatient visit in our system was with family medicine on 5/19 for evaluation of abdominal pain.   Physical Exam   Triage Vital Signs: ED Triage Vitals  Encounter Vitals Group     BP 06/24/24 1027 (!) 194/88     Girls Systolic BP Percentile --      Girls Diastolic BP Percentile --      Boys Systolic BP Percentile --      Boys Diastolic BP Percentile --      Pulse Rate 06/24/24 1027 65     Resp 06/24/24 1027 16     Temp 06/24/24 1027 97.7 F (36.5 C)     Temp Source 06/24/24 1027 Oral     SpO2 06/24/24 1027 100 %     Weight 06/24/24 1028 162  lb (73.5 kg)     Height 06/24/24 1028 5' 7 (1.702 m)     Head Circumference --      Peak Flow --      Pain Score 06/24/24 1028 8     Pain Loc --      Pain Education --      Exclude from Growth Chart --     Most recent vital signs: Vitals:   06/24/24 1027 06/24/24 1413  BP: (!) 194/88 (!) 174/90  Pulse: 65 62  Resp: 16 16  Temp: 97.7 F (36.5 C) 98.1 F (36.7 C)  SpO2: 100% 100%     General: Awake, no distress.  CV:  Good peripheral perfusion.  Resp:  Normal effort.  Abd:  No distention.  Other:  EOMI.  PERRLA.  No photophobia.  No facial droop.  Normal motor and sensory function to face bilaterally in all III nerve distributions.  5/5 motor strength and intact sensation all extremities.  Limping gait, baseline for the patient.  No pronator drift.  No ataxia.   ED Results / Procedures / Treatments   Labs (all labs ordered are listed, but only abnormal results  are displayed) Labs Reviewed  CBC WITH DIFFERENTIAL/PLATELET - Abnormal; Notable for the following components:      Result Value   Hemoglobin 17.1 (*)    All other components within normal limits  COMPREHENSIVE METABOLIC PANEL WITH GFR - Abnormal; Notable for the following components:   Glucose, Bld 110 (*)    All other components within normal limits     EKG    RADIOLOGY    PROCEDURES:  Critical Care performed: No  Procedures   MEDICATIONS ORDERED IN ED: Medications - No data to display   IMPRESSION / MDM / ASSESSMENT AND PLAN / ED COURSE  I reviewed the triage vital signs and the nursing notes.  63 year old male with PMH as noted above presents with paresthesias to the left arm and left face over the last month or more which are unchanged today.  He does not have any weakness or any actual numbness or sensory deficit, just a tingling sensation.  Thorough neurologic exam reveals no motor or sensory deficits.  Differential diagnosis includes, but is not limited to, cervical radiculopathy,  other neuropathic pain, trigeminal neuralgia, electrolyte abnormality, other metabolic cause.  There is no clinical evidence for a CVA.  There is no evidence of significant nerve impingement from a cervical spine lesion given that he has no motor deficits or any actual decreased sensation.  The symptoms have been unchanged for the last month.  Patient's presentation is most consistent with acute complicated illness / injury requiring diagnostic workup.  BMP and CBC were obtained from triage and are unremarkable.  I offered the patient imaging including likely MRI of the cervical spine with a consideration for brain imaging, although I expressed that these were not strictly necessary given the duration of his symptoms and the lack of actual deficits on exam.  The patient states that he wants to go home and declines the studies at this time.  I have prescribed prednisone  and gabapentin  for symptom control.  I recommended that he follow-up with his primary care provider and have also provided neurology follow-up.  I had an extensive discussion with him about his symptoms, the likely etiology, the plan of care, and on return precautions; he expressed understanding and agreement.   FINAL CLINICAL IMPRESSION(S) / ED DIAGNOSES   Final diagnoses:  Arm paresthesia, left     Rx / DC Orders   ED Discharge Orders          Ordered    predniSONE  (DELTASONE ) 20 MG tablet  Daily with breakfast        06/24/24 1406    gabapentin  (NEURONTIN ) 100 MG capsule  3 times daily        06/24/24 1406             Note:  This document was prepared using Dragon voice recognition software and may include unintentional dictation errors.    Jacolyn Pae, MD 06/24/24 1536

## 2024-06-24 NOTE — Discharge Instructions (Addendum)
 Take the prednisone  daily for the next 5 days.  This will decrease inflammation and should help with the nerve pain.  You can also take the gabapentin  3 times daily.  Make an appointment to follow-up with your primary care provider.  Have also given you information for an outpatient neurologist for follow-up.  In the meantime, return to the ER for new, worsening, or persistent severe numbness or tingling, any weakness on that left side, difficulty holding things, droop or asymmetry in your face, vision changes, difficulty speaking, difficulty walking or worsening limp, or any other new or worsening symptoms that concern you.

## 2024-06-25 ENCOUNTER — Encounter: Payer: Self-pay | Admitting: Physician Assistant

## 2024-06-25 ENCOUNTER — Ambulatory Visit (INDEPENDENT_AMBULATORY_CARE_PROVIDER_SITE_OTHER): Admitting: Physician Assistant

## 2024-06-25 VITALS — BP 150/78 | HR 94 | Temp 98.8°F | Ht 67.0 in | Wt 160.0 lb

## 2024-06-25 DIAGNOSIS — I700 Atherosclerosis of aorta: Secondary | ICD-10-CM

## 2024-06-25 DIAGNOSIS — I10 Essential (primary) hypertension: Secondary | ICD-10-CM

## 2024-06-25 DIAGNOSIS — R202 Paresthesia of skin: Secondary | ICD-10-CM | POA: Insufficient documentation

## 2024-06-25 MED ORDER — ROSUVASTATIN CALCIUM 5 MG PO TABS
5.0000 mg | ORAL_TABLET | Freq: Every day | ORAL | 1 refills | Status: AC
Start: 2024-06-25 — End: ?

## 2024-06-25 MED ORDER — AMLODIPINE BESYLATE 10 MG PO TABS
10.0000 mg | ORAL_TABLET | Freq: Every day | ORAL | 1 refills | Status: AC
Start: 2024-06-25 — End: ?

## 2024-06-25 NOTE — Assessment & Plan Note (Signed)
 Almost definitely caused by current responsibilities at work.  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  I suspect his symptoms will spontaneously improve.  In the meantime, encouraged local heat, gentle stretching, massage, and gabapentin  for symptom control.  Might also consider multivitamin or B complex.

## 2024-06-25 NOTE — Assessment & Plan Note (Signed)
 Restart rosuvastatin  for ASCVD risk reduction

## 2024-06-25 NOTE — Transitions of Care (Post Inpatient/ED Visit) (Signed)
   06/25/2024  Name: Jeffrey Figueroa MRN: [id number] DOB: 11/04/1960  Today's TOC FU Call Status: Today's TOC FU Call Status:: Successful TOC FU Call Completed Unsuccessful Call (1st Attempt) Date: 06/24/24 Unsuccessful Call (2nd Attempt) Date: 06/25/24 [facility] FU Call Complete Date: 06/25/24 Patient's Name and Date of Birth confirmed.  Transition Care Management Follow-up Telephone Call Date of Discharge: 06/24/24 Discharge Facility: [facility]) Type of Discharge: Emergency Department Reason for ED Visit:  (Paresthesia of skin) How have you been since you were released from the hospital?: Better Any questions or concerns?: No  Items Reviewed: Did you receive and understand the discharge instructions provided?: Yes Medications obtained,verified, and reconciled?: Yes (Medications Reviewed) Any new allergies since your discharge?: No Dietary orders reviewed?: NA Do you have support at home?: Yes  Medications Reviewed Today: Medications Reviewed Today   Medications were not reviewed in this encounter     Home Care and Equipment/Supplies: Were Home Health Services Ordered?: NA Any new equipment or medical supplies ordered?: NA  Functional Questionnaire: Do you need assistance with bathing/showering or dressing?: No Do you need assistance with meal preparation?: No Do you need assistance with eating?: No Do you have difficulty maintaining continence: No Do you need assistance with getting out of bed/getting out of a chair/moving?: No Do you have difficulty managing or taking your medications?: No  Follow up appointments reviewed: PCP Follow-up appointment confirmed?: Yes Date of PCP follow-up appointment?: 06/25/24 Follow-up Provider: Rolan Hoyle Specialist [facility] Follow-up appointment confirmed?: Yes Do you need transportation to your follow-up appointment?: No Do you understand care options if your condition(s) worsen?: Yes-patient verbalized  understanding    SIGNATURE Steva Nolah Krenzer, CMA

## 2024-06-25 NOTE — Progress Notes (Signed)
 Date:  06/25/2024   Name:  Jeffrey Figueroa   DOB:  06-01-1961   MRN:  [id number]   Chief Complaint: Hospitalization Follow-up (Pt still feels the same )  Flank Pain This is a recurrent problem. Episode frequency: comes and goes. The quality of the pain is described as aching. The pain does not radiate. The pain is at a severity of 3/10. The pain is mild.   Jeffrey Figueroa presents today (overdue for routine care) as a f/u from ED visit yesterday where he was evaluated for intermittent paresthesia of the left arm and face for the last 1 to 2 months.  He has no motor deficits.  He can reliably reproduce his symptoms by tilting his head toward the left, and relieve the symptoms by tilting his head to the right.  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   He stopped amlodipine  and rosuvastatin  because he ran out of the medication and did not request refill.  Says these medications did not cause any problems for him.  He is not monitoring blood pressure at home and does not have a cuff.  Continues to smoke cigarettes.   Medication list has been reviewed and updated.  Current Meds  Medication Sig   gabapentin  (NEURONTIN ) 100 MG capsule Take 1 capsule (100 mg total) by mouth 3 (three) times daily for 5 days.   predniSONE  (DELTASONE ) 20 MG tablet Take 3 tablets (60 mg total) by mouth daily with breakfast for 5 days.     Review of Systems  Genitourinary:  Positive for flank pain.    Patient Active Problem List   Diagnosis Date Noted   Paresthesia of left arm 06/25/2024   Aortic atherosclerosis (HCC) 10/08/2023   Tobacco use disorder 10/08/2023   Generalized abdominal pain 08/26/2023   Primary hypertension 08/26/2023   Right upper quadrant abdominal tenderness without rebound tenderness 08/26/2023    No Known Allergies  Immunization History  Administered Date(s) Administered    Influenza-Unspecified 07/24/2023   PFIZER(Purple Top)SARS-COV-2 Vaccination 07/24/2023   Tdap 09/06/2021    Past Surgical History:  Procedure Laterality Date   BACK SURGERY     SPINE SURGERY     Titanium Rod    Social History   Tobacco Use   Smoking status: Every Day    Current packs/day: 0.75    Average packs/day: 0.7 packs/day for 43.7 years (32.8 ttl pk-yrs)    Types: Cigarettes    Start date: 1982   Smokeless tobacco: Never  Vaping Use   Vaping status: Never Used  Substance Use Topics   Alcohol use: Yes    Alcohol/week: 25.0 standard drinks of alcohol    Types: 25 Shots of liquor per week   Drug use: Never    Family History  Problem Relation Age of Onset   Diabetes Mother         06/25/2024    2:49 PM 10/29/2023    9:56 AM 10/08/2023    9:29 AM  GAD 7 : Generalized Anxiety Score  Nervous, Anxious, on Edge 0 0 0  Control/stop worrying 0 0 0  Worry too much - different things 0 0 0  Trouble relaxing 0 0 0  Restless 0 0 0  Easily annoyed or irritable 0 0 0  Afraid - awful might happen 0 0 0  Total GAD 7 Score 0 0 0  Anxiety Difficulty Not difficult  at all Not difficult at all Not difficult at all       06/25/2024    2:49 PM 10/29/2023    9:56 AM 10/08/2023    9:29 AM  Depression screen PHQ 2/9  Decreased Interest 0 0 0  Down, Depressed, Hopeless 0 0 0  PHQ - 2 Score 0 0 0  Altered sleeping  0 0  Tired, decreased energy  0 0  Change in appetite  0 0  Feeling bad or failure about yourself   0 0  Trouble concentrating  0 0  Moving slowly or fidgety/restless  0 0  Suicidal thoughts  0 0  PHQ-9 Score  0 0  Difficult doing work/chores  Not difficult at all Not difficult at all    BP Readings from Last 3 Encounters:  06/25/24 (!) 150/78  06/24/24 (!) 174/90  03/09/24 126/72    Wt Readings from Last 3 Encounters:  06/25/24 160 lb (72.6 kg)  06/24/24 162 lb (73.5 kg)  03/09/24 156 lb 6.4 oz (70.9 kg)    BP (!) 150/78 (Cuff Size: Normal)   Pulse 94    Temp 98.8 F (37.1 C)   Ht 5' 7 (1.702 m)   Wt 160 lb (72.6 kg)   SpO2 95%   BMI 25.06 kg/m   Physical Exam Vitals and nursing note reviewed.  Constitutional:      Appearance: Normal appearance.  Neck:     Vascular: No carotid bruit.  Cardiovascular:     Rate and Rhythm: Normal rate.  Pulmonary:     Effort: Pulmonary effort is normal.  Abdominal:     General: There is no distension.  Musculoskeletal:        General: Normal range of motion.     Comments: Moderate point tenderness at left superior trapezius.  Left lateral bending at the neck reproduces his symptoms, and right lateral bending relieves them.  Skin:    General: Skin is warm and dry.  Neurological:     Mental Status: He is alert and oriented to person, place, and time.     Gait: Gait is intact.  Psychiatric:        Mood and Affect: Mood and affect normal.     Recent Labs     Component Value Date/Time   NA 141 06/24/2024 1031   NA 138 03/09/2024 1657   K 3.8 06/24/2024 1031   CL 106 06/24/2024 1031   CO2 23 06/24/2024 1031   GLUCOSE 110 (H) 06/24/2024 1031   BUN 11 06/24/2024 1031   BUN 12 03/09/2024 1657   CREATININE 0.89 06/24/2024 1031   CALCIUM  9.0 06/24/2024 1031   PROT 7.3 06/24/2024 1031   PROT 7.1 03/09/2024 1657   ALBUMIN 3.8 06/24/2024 1031   ALBUMIN 4.1 03/09/2024 1657   AST 23 06/24/2024 1031   ALT 18 06/24/2024 1031   ALKPHOS 79 06/24/2024 1031   BILITOT 0.6 06/24/2024 1031   BILITOT 0.8 03/09/2024 1657   GFRNONAA >60 06/24/2024 1031    Lab Results  Component Value Date   WBC 6.0 06/24/2024   HGB 17.1 (H) 06/24/2024   HCT 50.2 06/24/2024   MCV 99.6 06/24/2024   PLT 207 06/24/2024   Lab Results  Component Value Date   HGBA1C 5.5 08/26/2023   Lab Results  Component Value Date   CHOL 175 10/29/2023   HDL 72 10/29/2023   LDLCALC 80 10/29/2023   TRIG 137 10/29/2023   CHOLHDL 2.4 10/29/2023   Lab Results  Component Value Date   TSH 1.450 10/29/2023      Assessment  and Plan:  Paresthesia of left arm Assessment & Plan: Almost definitely caused by current responsibilities at work.  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  I suspect his symptoms will spontaneously improve.  In the meantime, encouraged local heat, gentle stretching, massage, and gabapentin  for symptom control.  Might also consider multivitamin or B complex.   Aortic atherosclerosis (HCC) Assessment & Plan: Restart rosuvastatin  for ASCVD risk reduction  Orders: -     Rosuvastatin  Calcium ; Take 1 tablet (5 mg total) by mouth daily.  Dispense: 90 tablet; Refill: 1  Primary hypertension Assessment & Plan: Elevated in clinic x 2, extremely high at ER yesterday, restart amlodipine  10 mg as previously prescribed.  Emphasized the importance of home BP monitoring.  Patient claims he has whitecoat hypertension, but I have informed him he will need to bring a log for evidence of this.  Orders: -     amLODIPine  Besylate; Take 1 tablet (10 mg total) by mouth daily.  Dispense: 90 tablet; Refill: 1     Return in about 4 weeks (around 07/23/2024) for OV f/u HTN, arm paresthesia.    Rolan Hoyle, PA-C, DMSc, Nutritionist [facility] [phone]

## 2024-06-25 NOTE — Transitions of Care (Post Inpatient/ED Visit) (Signed)
   06/25/2024  Name: Jeffrey Figueroa MRN: [id number] DOB: 12-05-1960  Today's TOC FU Call Status: Today's TOC FU Call Status:: Unsuccessful Call (2nd Attempt) Unsuccessful Call (1st Attempt) Date: 06/24/24 Unsuccessful Call (2nd Attempt) Date: 06/25/24  Attempted to reach the patient regarding the most recent Inpatient/ED visit.  Follow Up Plan: Additional outreach attempts will be made to reach the patient to complete the Transitions of Care (Post Inpatient/ED visit) call.   Signature [employer], CMA

## 2024-06-25 NOTE — Assessment & Plan Note (Signed)
 Elevated in clinic x 2, extremely high at ER yesterday, restart amlodipine  10 mg as previously prescribed.  Emphasized the importance of home BP monitoring.  Patient claims he has whitecoat hypertension, but I have informed him he will need to bring a log for evidence of this.

## 2024-07-07 ENCOUNTER — Telehealth: Payer: Self-pay

## 2024-07-07 NOTE — Transitions of Care (Post Inpatient/ED Visit) (Unsigned)
   07/07/2024  Name: Jeffrey Figueroa MRN: [id number] DOB: 1961/03/22  Today's TOC FU Call Status: Today's TOC FU Call Status:: Unsuccessful Call (1st Attempt) Unsuccessful Call (1st Attempt) Date: 07/07/24  Attempted to reach the patient regarding the most recent Inpatient/ED visit.  Follow Up Plan: Additional outreach attempts will be made to reach the patient to complete the Transitions of Care (Post Inpatient/ED visit) call.   Signature [employer], CMA

## 2024-07-08 NOTE — Transitions of Care (Post Inpatient/ED Visit) (Signed)
   07/08/2024  Name: Jeffrey Figueroa MRN: [id number] DOB: 16-Jan-1961  Today's TOC FU Call Status: Today's TOC FU Call Status:: Successful TOC FU Call Completed Unsuccessful Call (1st Attempt) Date: 07/07/24 [facility] FU Call Complete Date: 07/08/24 Patient's Name and Date of Birth confirmed.  Transition Care Management Follow-up Telephone Call Date of Discharge: 07/06/24 Discharge Facility: Other [facility]) Name of Other (Non[facility]) Discharge Facility: [facility] Type of Discharge: Emergency Department Reason for ED Visit: Other: (Abdominal pain) How have you been since you were released from the hospital?: Same Any questions or concerns?: No  Items Reviewed: Did you receive and understand the discharge instructions provided?: Yes Medications obtained,verified, and reconciled?: Yes (Medications Reviewed) Any new allergies since your discharge?: No Dietary orders reviewed?: NA Do you have support at home?: Yes  Medications Reviewed Today: Medications Reviewed Today     Reviewed by Alanzo Lamb, Steva SAILOR, CMA (Certified Medical Assistant) on 07/08/24 at [phone]  Med List Status: <None>   Medication Order Taking? Sig Documenting Provider Last Dose Status Informant  amLODipine  (NORVASC ) 10 MG tablet [id number] Yes Take 1 tablet (10 mg total) by mouth daily. Manya Toribio SQUIBB, PA  Active   gabapentin  (NEURONTIN ) 100 MG capsule [id number] Yes Take 1 capsule (100 mg total) by mouth 3 (three) times daily for 5 days. Jacolyn Pae, MD  Active   rosuvastatin  (CRESTOR ) 5 MG tablet [id number] Yes Take 1 tablet (5 mg total) by mouth daily. Manya Toribio SQUIBB, PA  Active             Home Care and Equipment/Supplies: Were Home Health Services Ordered?: NA Any new equipment or medical supplies ordered?: NA  Functional Questionnaire: Do you need assistance with bathing/showering or dressing?: No Do you need assistance with meal preparation?: No Do you need assistance with eating?:  No Do you have difficulty maintaining continence: No Do you need assistance with getting out of bed/getting out of a chair/moving?: No Do you have difficulty managing or taking your medications?: No  Follow up appointments reviewed: PCP Follow-up appointment confirmed?: Yes Specialist Hospital Follow-up appointment confirmed?: Yes Date of Specialist follow-up appointment?: 08/25/24 Follow-Up Specialty Provider:: [facility] Do you need transportation to your follow-up appointment?: No Do you understand care options if your condition(s) worsen?: Yes-patient verbalized understanding    SIGNATURE Steva Winfred Iiams, CMA

## 2024-07-16 ENCOUNTER — Emergency Department (HOSPITAL_COMMUNITY)

## 2024-07-16 ENCOUNTER — Emergency Department (HOSPITAL_COMMUNITY)
Admission: EM | Admit: 2024-07-16 | Discharge: 2024-07-16 | Disposition: A | Discharge status: Home / Self Care | Attending: Emergency Medicine | Admitting: Emergency Medicine

## 2024-07-16 ENCOUNTER — Other Ambulatory Visit: Payer: Self-pay

## 2024-07-16 DIAGNOSIS — R0600 Dyspnea, unspecified: Secondary | ICD-10-CM | POA: Diagnosis not present

## 2024-07-16 DIAGNOSIS — R0789 Other chest pain: Secondary | ICD-10-CM | POA: Diagnosis present

## 2024-07-16 DIAGNOSIS — Z79899 Other long term (current) drug therapy: Secondary | ICD-10-CM | POA: Diagnosis not present

## 2024-07-16 DIAGNOSIS — Y92410 Unspecified street and highway as the place of occurrence of the external cause: Secondary | ICD-10-CM | POA: Diagnosis not present

## 2024-07-16 DIAGNOSIS — M25512 Pain in left shoulder: Secondary | ICD-10-CM | POA: Diagnosis not present

## 2024-07-16 LAB — COMPREHENSIVE METABOLIC PANEL WITH GFR
ALT: 36 U/L (ref 0–44)
AST: 46 U/L — ABNORMAL HIGH (ref 15–41)
Albumin: 3.3 g/dL — ABNORMAL LOW (ref 3.5–5.0)
Alkaline Phosphatase: 73 U/L (ref 38–126)
Anion gap: 13 (ref 5–15)
BUN: 15 mg/dL (ref 8–23)
CO2: 19 mmol/L — ABNORMAL LOW (ref 22–32)
Calcium: 8.5 mg/dL — ABNORMAL LOW (ref 8.9–10.3)
Chloride: 111 mmol/L (ref 98–111)
Creatinine, Ser: 1.06 mg/dL (ref 0.61–1.24)
GFR, Estimated: >60 mL/min (ref >60)
Glucose, Bld: 112 mg/dL — ABNORMAL HIGH (ref 70–99)
Potassium: 3.8 mmol/L (ref 3.5–5.1)
Sodium: 143 mmol/L (ref 135–145)
Total Bilirubin: 0.7 mg/dL (ref 0.0–1.2)
Total Protein: 6.5 g/dL (ref 6.5–8.1)

## 2024-07-16 LAB — I-STAT CHEM 8, ED
BUN: 18 mg/dL (ref 8–23)
Calcium, Ion: 1.07 mmol/L — ABNORMAL LOW (ref 1.15–1.40)
Chloride: 110 mmol/L (ref 98–111)
Creatinine, Ser: 1.3 mg/dL — ABNORMAL HIGH (ref 0.61–1.24)
Glucose, Bld: 113 mg/dL — ABNORMAL HIGH (ref 70–99)
HCT: 46 % (ref 39.0–52.0)
Hemoglobin: 15.6 g/dL (ref 13.0–17.0)
Potassium: 3.7 mmol/L (ref 3.5–5.1)
Sodium: 146 mmol/L — ABNORMAL HIGH (ref 135–145)
TCO2: 21 mmol/L — ABNORMAL LOW (ref 22–32)

## 2024-07-16 LAB — CBC
HCT: 46.3 % (ref 39.0–52.0)
Hemoglobin: 15.5 g/dL (ref 13.0–17.0)
MCH: 33.5 pg (ref 26.0–34.0)
MCHC: 33.5 g/dL (ref 30.0–36.0)
MCV: 100.2 fL — ABNORMAL HIGH (ref 80.0–100.0)
Platelets: 199 K/uL (ref 150–400)
RBC: 4.62 MIL/uL (ref 4.22–5.81)
RDW: 11.9 % (ref 11.5–15.5)
WBC: 6.4 K/uL (ref 4.0–10.5)
nRBC: 0 % (ref 0.0–0.2)

## 2024-07-16 MED ORDER — IOHEXOL 350 MG/ML SOLN
75.0000 mL | Freq: Once | INTRAVENOUS | Status: AC | PRN
  Administered 2024-07-16: 75 mL via INTRAVENOUS
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
## 2024-07-16 NOTE — ED Notes (Signed)
 CCMD called.
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
## 2024-07-20 ENCOUNTER — Telehealth: Payer: Self-pay

## 2024-07-20 NOTE — Transitions of Care (Post Inpatient/ED Visit) (Unsigned)
   07/20/2024  Name: Jeffrey Figueroa MRN: [id number] DOB: [birth year]  Today's TOC FU Call Status: Unsuccessful Call (1st Attempt) Date: 07/20/24  Attempted to reach the patient regarding the most recent Inpatient/ED visit.  Follow Up Plan: Additional outreach attempts will be made to reach the patient to complete the Transitions of Care (Post Inpatient/ED visit) call.   [employer], CMA

## 2024-07-21 NOTE — Transitions of Care (Post Inpatient/ED Visit) (Unsigned)
   07/21/2024  Name: Jeffrey Figueroa MRN: [id number] DOB: [birth year]  Today's TOC FU Call Status: Today's TOC FU Call Status:: Unsuccessful Call (2nd Attempt) Unsuccessful Call (1st Attempt) Date: 07/20/24 Unsuccessful Call (2nd Attempt) Date: 07/21/24  Attempted to reach the patient regarding the most recent Inpatient/ED visit.  Follow Up Plan: Additional outreach attempts will be made to reach the patient to complete the Transitions of Care (Post Inpatient/ED visit) call.   [employer], CMA

## 2024-07-22 ENCOUNTER — Ambulatory Visit: Admitting: Physician Assistant

## 2024-07-22 NOTE — Transitions of Care (Post Inpatient/ED Visit) (Signed)
   07/22/2024  Name: Jeffrey Figueroa MRN: [id number] DOB: December 05, 1960  Today's TOC FU Call Status: Today's TOC FU Call Status:: Unsuccessful Call (3rd Attempt) Unsuccessful Call (1st Attempt) Date: 07/20/24 Unsuccessful Call (2nd Attempt) Date: 07/21/24 Unsuccessful Call (3rd Attempt) Date: 07/22/24  Attempted to reach the patient regarding the most recent Inpatient/ED visit.  Follow Up Plan: No further outreach attempts will be made at this time. We have been unable to contact the patient.  [employer], CMA

## 2024-07-23 ENCOUNTER — Ambulatory Visit: Admitting: Physician Assistant

## 2024-08-25 ENCOUNTER — Ambulatory Visit (INDEPENDENT_AMBULATORY_CARE_PROVIDER_SITE_OTHER): Admitting: Physician Assistant

## 2024-08-25 ENCOUNTER — Encounter: Payer: Self-pay | Admitting: Physician Assistant

## 2024-08-25 VITALS — BP 140/82 | HR 89 | Ht 67.0 in | Wt 166.0 lb

## 2024-08-25 DIAGNOSIS — R1013 Epigastric pain: Secondary | ICD-10-CM | POA: Diagnosis not present

## 2024-08-25 DIAGNOSIS — Z1211 Encounter for screening for malignant neoplasm of colon: Secondary | ICD-10-CM | POA: Diagnosis not present

## 2024-08-25 DIAGNOSIS — F1010 Alcohol abuse, uncomplicated: Secondary | ICD-10-CM

## 2024-08-25 MED ORDER — OMEPRAZOLE 40 MG PO CPDR
40.0000 mg | DELAYED_RELEASE_CAPSULE | Freq: Every day | ORAL | 3 refills | Status: AC
Start: 2024-08-25 — End: ?

## 2024-08-25 MED ORDER — NA SULFATE-K SULFATE-MG SULF 17.5-3.13-1.6 GM/177ML PO SOLN: 1.0000 | ORAL | 0 refills | Status: AC

## 2024-08-25 NOTE — Patient Instructions (Addendum)
 We have sent the following medications to your pharmacy for you to pick up at your convenience: Suprep, Omeprazole   Continue to cut back on alcohol.  You have been scheduled for an endoscopy and colonoscopy. Please follow the written instructions given to you at your visit today.  If you use inhalers (even only as needed), please bring them with you on the day of your procedure.  DO NOT TAKE 7 DAYS PRIOR TO TEST- Trulicity (dulaglutide) Ozempic, Wegovy (semaglutide) Mounjaro (tirzepatide) Bydureon Bcise (exanatide extended release)  DO NOT TAKE 1 DAY PRIOR TO YOUR TEST Rybelsus (semaglutide) Adlyxin (lixisenatide) Victoza (liraglutide) Byetta (exanatide) ____________________________________________________________  Due to recent changes in healthcare laws, you may see the results of your imaging and laboratory studies on MyChart before your provider has had a chance to review them.  We understand that in some cases there may be results that are confusing or concerning to you. Not all laboratory results come back in the same time frame and the provider may be waiting for multiple results in order to interpret others.  Please give us  48 hours in order for your provider to thoroughly review all the results before contacting the office for clarification of your results.   Thank you for choosing me and  Gastroenterology.  Delon Failing, PA-C
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
## 2024-09-15 ENCOUNTER — Encounter: Payer: Self-pay | Admitting: Emergency Medicine

## 2024-09-15 ENCOUNTER — Other Ambulatory Visit: Payer: Self-pay

## 2024-09-15 ENCOUNTER — Emergency Department: Admission: EM | Admit: 2024-09-15 | Discharge: 2024-09-15

## 2024-09-15 DIAGNOSIS — Z0289 Encounter for other administrative examinations: Secondary | ICD-10-CM | POA: Diagnosis present

## 2024-09-15 DIAGNOSIS — Z008 Encounter for other general examination: Secondary | ICD-10-CM
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
## 2024-09-21 ENCOUNTER — Telehealth: Payer: Self-pay

## 2024-09-21 NOTE — Transitions of Care (Post Inpatient/ED Visit) (Signed)
   09/21/2024  Name: Jeffrey Figueroa MRN: [id number] DOB: 04-Oct-1961  Today's TOC FU Call Status: Today's TOC FU Call Status:: Successful TOC FU Call Completed TOC FU Call Complete Date: 09/21/24  Patient's Name and Date of Birth confirmed. Name, DOB  Transition Care Management Follow-up Telephone Call Date of Discharge: 09/15/24 Discharge Facility: [facility]) Type of Discharge: Emergency Department Reason for ED Visit: Other: How have you been since you were released from the hospital?: Better Any questions or concerns?: No  Items Reviewed: Did you receive and understand the discharge instructions provided?: Yes Medications obtained,verified, and reconciled?: Yes (Medications Reviewed) Any new allergies since your discharge?: No Dietary orders reviewed?: NA Do you have support at home?: Yes  Medications Reviewed Today: Medications Reviewed Today     Reviewed by Revan Gendron, Steva SAILOR, CMA (Certified Medical Assistant) on 09/21/24 at 1437  Med List Status: <None>   Medication Order Taking? Sig Documenting Provider Last Dose Status Informant  amLODipine  (NORVASC ) 10 MG tablet [id number]  Take 1 tablet (10 mg total) by mouth daily.  Patient not taking: Reported on 08/25/2024   Manya Toribio SQUIBB, PA  Active   gabapentin  (NEURONTIN ) 100 MG capsule [id number]  Take 1 capsule (100 mg total) by mouth 3 (three) times daily for 5 days.  Patient not taking: Reported on 08/25/2024   Jacolyn Pae, MD  Expired 07/08/24 2359   Na Sulfate-K Sulfate-Mg Sulfate concentrate (SUPREP BOWEL PREP KIT) 17.5-3.13-1.6 GM/177ML SOLN [id number]  Take 1 kit (354 mLs total) by mouth as directed. For colonoscopy prep Beather Delon Gibson, PA  Active   omeprazole  (PRILOSEC) 40 MG capsule [id number]  Take 1 capsule (40 mg total) by mouth daily. Beather Delon Gibson, [place]  Active   rosuvastatin  (CRESTOR ) 5 MG tablet [id number]  Take 1 tablet (5 mg total) by mouth daily.  Patient not  taking: Reported on 08/25/2024   Manya Toribio SQUIBB, PA  Active             Home Care and Equipment/Supplies: Were Home Health Services Ordered?: NA Any new equipment or medical supplies ordered?: NA  Functional Questionnaire: Do you need assistance with bathing/showering or dressing?: No Do you need assistance with meal preparation?: No Do you need assistance with eating?: No Do you have difficulty maintaining continence: No Do you need assistance with getting out of bed/getting out of a chair/moving?: No Do you have difficulty managing or taking your medications?: No  Follow up appointments reviewed: PCP Follow-up appointment confirmed?: NA Specialist Hospital Follow-up appointment confirmed?: NA Do you need transportation to your follow-up appointment?: No Do you understand care options if your condition(s) worsen?: Yes-patient verbalized understanding  Pt refused appt stated he will call back for a nerve in his neck. This is a different problem from what the pt was seen for.   SIGNATURE Steva Kammy Klett, CMA

## 2024-09-29 DIAGNOSIS — F1010 Alcohol abuse, uncomplicated: Secondary | ICD-10-CM | POA: Insufficient documentation

## 2024-09-30 ENCOUNTER — Ambulatory Visit: Admitting: Physician Assistant

## 2024-09-30 ENCOUNTER — Encounter: Admitting: Gastroenterology

## 2024-09-30 ENCOUNTER — Encounter: Payer: Self-pay | Admitting: Physician Assistant

## 2024-09-30 VITALS — BP 132/62 | HR 92 | Temp 98.4°F | Ht 67.0 in | Wt 169.0 lb

## 2024-09-30 DIAGNOSIS — N63 Unspecified lump in unspecified breast: Secondary | ICD-10-CM | POA: Insufficient documentation

## 2024-09-30 DIAGNOSIS — F17200 Nicotine dependence, unspecified, uncomplicated: Secondary | ICD-10-CM | POA: Diagnosis not present

## 2024-09-30 DIAGNOSIS — M545 Low back pain, unspecified: Secondary | ICD-10-CM

## 2024-09-30 DIAGNOSIS — G8929 Other chronic pain: Secondary | ICD-10-CM | POA: Insufficient documentation

## 2024-09-30 MED ORDER — CELECOXIB 200 MG PO CAPS: 200.0000 mg | ORAL_CAPSULE | Freq: Every day | ORAL | 1 refills | Status: AC | PRN

## 2024-09-30 NOTE — Assessment & Plan Note (Signed)
 Begin celecoxib, emphasized that this will replace ibuprofen  use but he may use Tylenol as needed if desired.  Also sending to PT.  Also reviewed McGill's big 3 for strengthening of back and core and preventing back injury.  Also discussed smoking as a pro inflammatory habit which will likely worsen arthritis in the short-term and long-term.

## 2024-09-30 NOTE — Assessment & Plan Note (Signed)
 Encouraged cessation.  Patient would like to work on gradual reduction of tobacco use.

## 2024-09-30 NOTE — Assessment & Plan Note (Signed)
 Tender, mobile, could be a cyst that may spontaneously resolved.  Will be ordering a right breast ultrasound and plan for this in about 2 weeks.  In the interim if it spontaneously resolves, he could cancel the ultrasound.  Advised to avoid palpating the area too much as this could prevent it  from resolving; suggested he check on it every 2 to 3 days.  Consider cool compress as well
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
## 2024-11-11 ENCOUNTER — Ambulatory Visit: Admitting: Physician Assistant
